# Patient Record
Sex: Male | Born: 1980
Health system: Southern US, Community
[De-identification: ages and names within clinical notes are randomized; demographics above are authoritative.]

## PROBLEM LIST (undated history)

## (undated) DIAGNOSIS — T4145XA Adverse effect of unspecified anesthetic, initial encounter: Secondary | ICD-10-CM

## (undated) DIAGNOSIS — F952 Tourette's disorder: Secondary | ICD-10-CM

## (undated) DIAGNOSIS — F429 Obsessive-compulsive disorder, unspecified: Secondary | ICD-10-CM

## (undated) DIAGNOSIS — F329 Major depressive disorder, single episode, unspecified: Secondary | ICD-10-CM

## (undated) DIAGNOSIS — I10 Essential (primary) hypertension: Secondary | ICD-10-CM

## (undated) DIAGNOSIS — T8859XA Other complications of anesthesia, initial encounter: Secondary | ICD-10-CM

## (undated) DIAGNOSIS — Z720 Tobacco use: Secondary | ICD-10-CM

## (undated) DIAGNOSIS — F32A Depression, unspecified: Secondary | ICD-10-CM

## (undated) DIAGNOSIS — Z8489 Family history of other specified conditions: Secondary | ICD-10-CM

## (undated) DIAGNOSIS — J45909 Unspecified asthma, uncomplicated: Secondary | ICD-10-CM

## (undated) HISTORY — PX: CIRCUMCISION: SUR203

---
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
## 2003-01-07 ENCOUNTER — Emergency Department (HOSPITAL_COMMUNITY): Admission: EM | Admit: 2003-01-07 | Discharge: 2003-01-08 | Payer: Self-pay | Admitting: *Deleted

## 2003-02-18 ENCOUNTER — Inpatient Hospital Stay (HOSPITAL_COMMUNITY): Admission: EM | Admit: 2003-02-18 | Discharge: 2003-02-24 | Payer: Self-pay | Admitting: Psychiatry

## 2003-03-04 ENCOUNTER — Emergency Department (HOSPITAL_COMMUNITY): Admission: EM | Admit: 2003-03-04 | Discharge: 2003-03-04 | Payer: Self-pay | Admitting: Emergency Medicine

## 2003-03-17 ENCOUNTER — Emergency Department (HOSPITAL_COMMUNITY): Admission: EM | Admit: 2003-03-17 | Discharge: 2003-03-17 | Payer: Self-pay | Admitting: Emergency Medicine

## 2003-07-23 ENCOUNTER — Inpatient Hospital Stay (HOSPITAL_COMMUNITY): Admission: EM | Admit: 2003-07-23 | Discharge: 2003-07-25 | Payer: Self-pay | Admitting: Psychiatry

## 2006-02-18 ENCOUNTER — Emergency Department: Payer: Self-pay | Admitting: Emergency Medicine

## 2006-07-20 ENCOUNTER — Ambulatory Visit: Payer: Self-pay | Admitting: Family Medicine

## 2006-07-28 ENCOUNTER — Emergency Department (HOSPITAL_COMMUNITY): Admission: EM | Admit: 2006-07-28 | Discharge: 2006-07-28 | Payer: Self-pay | Admitting: Emergency Medicine

## 2006-08-01 ENCOUNTER — Ambulatory Visit: Payer: Self-pay | Admitting: Family Medicine

## 2006-08-03 ENCOUNTER — Ambulatory Visit: Payer: Self-pay | Admitting: Family Medicine

## 2006-10-26 ENCOUNTER — Emergency Department (HOSPITAL_COMMUNITY): Admission: EM | Admit: 2006-10-26 | Discharge: 2006-10-26 | Payer: Self-pay | Admitting: Family Medicine

## 2006-11-27 ENCOUNTER — Ambulatory Visit: Payer: Self-pay | Admitting: Family Medicine

## 2007-04-14 ENCOUNTER — Emergency Department (HOSPITAL_COMMUNITY): Admission: EM | Admit: 2007-04-14 | Discharge: 2007-04-14 | Payer: Self-pay | Admitting: Family Medicine

## 2007-09-13 ENCOUNTER — Ambulatory Visit: Payer: Self-pay | Admitting: Internal Medicine

## 2007-11-21 ENCOUNTER — Ambulatory Visit: Payer: Self-pay | Admitting: Internal Medicine

## 2008-06-21 ENCOUNTER — Emergency Department (HOSPITAL_COMMUNITY): Admission: EM | Admit: 2008-06-21 | Discharge: 2008-06-21 | Payer: Self-pay | Admitting: Emergency Medicine

## 2008-10-02 ENCOUNTER — Emergency Department (HOSPITAL_COMMUNITY): Admission: EM | Admit: 2008-10-02 | Discharge: 2008-10-02 | Payer: Self-pay | Admitting: Emergency Medicine

## 2008-10-28 ENCOUNTER — Emergency Department (HOSPITAL_COMMUNITY): Admission: EM | Admit: 2008-10-28 | Discharge: 2008-10-28 | Payer: Self-pay | Admitting: Emergency Medicine

## 2009-01-26 ENCOUNTER — Emergency Department (HOSPITAL_COMMUNITY): Admission: EM | Admit: 2009-01-26 | Discharge: 2009-01-26 | Payer: Self-pay | Admitting: Emergency Medicine

## 2009-01-30 ENCOUNTER — Emergency Department (HOSPITAL_COMMUNITY): Admission: EM | Admit: 2009-01-30 | Discharge: 2009-01-30 | Payer: Self-pay | Admitting: Emergency Medicine

## 2009-02-05 ENCOUNTER — Ambulatory Visit: Payer: Self-pay | Admitting: Internal Medicine

## 2009-02-17 ENCOUNTER — Emergency Department (HOSPITAL_COMMUNITY): Admission: EM | Admit: 2009-02-17 | Discharge: 2009-02-18 | Payer: Self-pay | Admitting: *Deleted

## 2009-05-28 ENCOUNTER — Telehealth (INDEPENDENT_AMBULATORY_CARE_PROVIDER_SITE_OTHER): Payer: Self-pay | Admitting: *Deleted

## 2009-06-02 ENCOUNTER — Ambulatory Visit: Payer: Self-pay | Admitting: Internal Medicine

## 2010-08-20 ENCOUNTER — Emergency Department (HOSPITAL_COMMUNITY): Admission: EM | Admit: 2010-08-20 | Discharge: 2010-08-20 | Payer: Self-pay | Admitting: Family Medicine

## 2010-09-16 ENCOUNTER — Emergency Department (HOSPITAL_COMMUNITY)
Admission: EM | Admit: 2010-09-16 | Discharge: 2010-09-16 | Payer: Self-pay | Source: Home / Self Care | Admitting: Family Medicine

## 2011-01-17 LAB — POCT CARDIAC MARKERS: Troponin i, poc: <0.05 ng/mL (ref 0.00–0.09)

## 2011-01-17 LAB — D-DIMER, QUANTITATIVE: D-Dimer, Quant: 0.28 ug/mL-FEU (ref 0.00–0.48)

## 2011-02-18 NOTE — Discharge Summary (Signed)
[facility]  Patient:    Bryan Cummings, Bryan Cummings                       MRN: [id number] Adm. Date:  [id number] Disc. Date: 04/07/00 Attending:  Veneta Penton                           Discharge Summary  REASON FOR ADMISSION:  This 30 year old black male was admitted to [facility] after being discharged from this unit approximately two weeks prior to this admission, after he became increasing psychotic, assaultive and aggressive in his group home.  At that time he was a danger to himself and others, attempting to harm himself, destroy furniture in the group home, and threatening to kill staff in the group home.  For further history of present illness, please see the patients psychiatric admission assessment.  PHYSICAL EXAMINATION:  At the time of admission was significant for a history obesity and was otherwise unremarkable, also with the exception of a sleep related breathing disorder and a history of mild mental retardation.  LABORATORY DATA:  The patient underwent a laboratory work to rule out any medical problems contributing to his symptomatology.  A urinalysis was unremarkable.  Urine drug screen was negative.  There were not other labs performed during the course of this hospitalization due to the patients recent admission two weeks prior to this one.  There were no x-rays, special procedures, complications during the course of this hospitalization, and the patient received no consultations.  HOSPITAL COURSE:  The patient rapidly adapted to unit routine, socializing well with both patients and staff.  He was begun on a trial of risperidone and titrated up to a dose of 3 mg p.o. q.a.m. and 6 mg q.h.s.  His initial symptoms of psychosis significant for thought insertion, thought blocking, looseness of associations, and magical thinking and response to internal stimuli all began to improve.  At the time of discharge, the patient  denied any suicidal or homicidal ideations.  Affect and mood had improved.  His thoughts are more organized.  He continues to display occasional magical thinking but denies having any made thoughts or problems with thought insertion.  He denies having any suicidal or homicidal ideation or plans to harm himself or others.  He states that he feels well.  He is able to perform his activities of daily living and to participate in all aspects of the therapeutic program to the extent that it is felt that the patient may be discharged to a less restrictive alternative level of care.  CONDITION ON DISCHARGE:  Improved.  POST HOSPITAL CARE PLAN:  The patient is discharged back to his group home. He will follow up with his outpatient psychiatrist and psychotherapist for individual therapy and medication management, consequently I will sign off on the case at this time.  The patient is discharged on unrestricted level of activity and he will resume a regular diet.  DISCHARGE MEDICATIONS: 1. Risperdal 3 mg p.o. q.a.m. and 6 mg p.o. q.h.s. 2. Luvox 100 mg p.o. q.d. 3. Neurontin 600 mg p.o. t.i.d.  FINAL DIAGNOSIS: Axis I:    Schizoaffective disorder. Axis II:   Mild mental retardation. Axis III:  Obesity, sleep related breathing disorder. Axis IV:   Current psychosocial stressors are severe. Axis V:    At time of admission code 15, at time of discharge code 30. DD:  04/07/00 TD:  04/07/00 Job: [id number]

## 2011-02-18 NOTE — H&P (Signed)
NAME:  Bryan Cummings, Bryan Cummings                          ACCOUNT NO.:  [id number]   MEDICAL RECORD NO.:  [id number]                   PATIENT TYPE:  IPS   LOCATION:  0400                                 [facility]ACILITY[facility]   PHYSICIAN:  Jeanice Lim, M.D.              DATE OF BIRTH:  1981/08/25   DATE OF ADMISSION:  07/23/2003  DATE OF DISCHARGE:  07/25/2003                                HISTORY & PHYSICAL   IDENTIFYING INFORMATION:  A 30 year old single black male, involuntarily  committed on July 23, 2003.   HISTORY OF PRESENT ILLNESS:  The patient presents with a history of being  here on commitment.  Papers report that the patient was suicidal, depressed,  having positive auditory hallucinations.  The patient was unable to contract  for safety.  The patient felt like he needed a break from his living  situation.  He states that they are accusing him of drinking beer at an  assisted living facility.  The patient states he was getting very  frustrated.  He states that they thought that because he had an episode of  vomiting.  He was also having some positive suicidal ideation with no  specific plan.  His sleep and appetite have been good.  He denies any  auditory hallucinations at this time, denies any visual hallucinations, and  states he is ready to go back to the group home.   PAST PSYCHIATRIC HISTORY:  He is a 30 year male.  He was here at [facility] in May of 2004 for psychosis and aggressive behavior.  He has  no history of a suicide attempt.   SOCIAL HISTORY:  A 30 year old single black male.  His grandmother is his  legal guardian.  He lives at [facility] for the past month.  He states that  the living situation is good.   FAMILY HISTORY:  Mother with neurological problem.   ALCOHOL DRUG HISTORY:  The patient smokes.  He states he does drink beer,  but he states it is not a problem.  He denies any drug use.   PAST MEDICAL HISTORY:  Primary care Kodee Ravert is  unknown.  The patient  reports he goes for treatment at [facility].  Medical problems  are Tourette's.   MEDICATIONS:  Has been on Haldol 2 mg in the morning, 2 at bedtime, Depakote  500 mg b.i.d., Cogentin 1 mg at h.s., trazodone 150 mg q.h.s..   DRUG ALLERGIES:  PENICILLIN.   PHYSICAL EXAMINATION:  The patient was assessed at [facility] with  no acute findings.  He is in no acute distress today.  He is a well-  nourished, well-developed young man.   LABORATORY DATA:  Valporic acid level of 66.3.  His CBC:  RDW is mildly  elevated at 16.1.  Urine drug screen is negative, CMET is within normal  limits.   MENTAL STATUS EXAM:  He is alert, morbidly obese, unkempt male.  There is no  eye contact.  He is somewhat malodorous.  Speech is clear.  The patient  states he feels fine.  He is agreeable and pleasant.  Thought processes:  Does not appear to be distracted by external stimuli, is voicing no suicidal  or homicidal ideations or delusions or flight of ideas.  Cognitive function  intact.  Memory is fair, judgment and insight are fair, appears to have  limited intellectual functioning.   ADMISSION DIAGNOSES:   AXIS I:  Major depressive disorder, severe, recurrent, rule out  schizoaffective disorder.   AXIS II:  Mild mental retardation.   AXIS III:  Tourette's.   AXIS IV:  Other psychosocial problems.   AXIS V:  Current is 30, past year is 67.   PLAN:  Involuntary commitment for psychosis and suicidal ideation.  Stabilize mood and thinking so the patient can be safe and functional.  Will  resume his medications, will check valporic acid level for therapeutic  range, will contact assisted living for concerns and background.  The  patient is to continue to be medication compliant.  Will have a nutritional  consult for the patient's morbid obesity, nutritional education.   TENTATIVE LENGTH OF CARE:  3-4 days.  ]     Landry Corporal, N.P.                        Jeanice Lim, M.D.    JO/MEDQ  D:  08/19/2003  T:  08/20/2003  Job:  [id number]

## 2011-02-18 NOTE — Discharge Summary (Signed)
[facility]  Patient:    Bryan Cummings, Bryan Cummings                       MRN: [id number] Adm. Date:  [id number] Disc. Date: [id number] Attending:  Veneta Penton                           Discharge Summary  REASON FOR ADMISSION:  This 30 year old black male was admitted with command auditory hallucinations telling him to kill himself and his peers in his group home.  For further history of present illness, please see the patients psychiatric admission assessment.  PHYSICAL EXAMINATION:  Remarkable only for his being overweight and having acne vulgaris.  He has an otherwise unremarkable physical examination.  LABORATORY EXAMINATION:  The patient underwent a laboratory workup to rule out any medical problems contributing to his symptomatology.  A CBC was remarkable for an RDW of 14.5 and was otherwise unremarkable.  Routine chem panel was within normal limits.  A hepatic panel was within normal limits.  UA was unremarkable.  A urine drug screen was negative.  Urine probe for gonorrhea and chlamydia were negative.  A thyroid panel was within normal limits.  RPR was nonreactive.  The patient received no x-rays, no special procedures, no additional consultations.  He sustained no complications throughout his hospital course.  HOSPITAL COURSE:  The patient rapidly adapted to unit routine, socializing well with both patients and staff.  He was increased on his Geodon and Neurontin to attempt to stabilize his mood and psychotic symptoms and at the time of discharge, the patient denies any auditory hallucinations.  He continues to display some magical thinking and thought insertion consistent with his previous diagnosis of schizoaffective disorder, but he is participating in all aspects of therapeutic treatment program.  He denies any homicidal or suicidal ideation.  He has shown no assaultive or aggressive behaviors while hospitalized.  He has been cooperative and  easily redirectable within the milieu.  He is able to perform all of his activities of daily living and consequently, he is felt to have reached his maximum benefits of hospitalization and is ready for discharge to a less restrictive alternative setting.  CONDITION ON DISCHARGE:  Improved.  DIAGNOSES: Axis I:    1. Schizoaffective disorder.            2. Conduct disorder. Axis II:   Schizotypal traits, rule out personality disorder. Axis III:  1. Acne vulgaris.            2. Obesity. Axis IV:   Current psychosocial stressors are severe. Axis V:    Code 10 on admission, code 30 on discharge.  FURTHER EVALUATION AND TREATMENT RECOMMENDATIONS: 1. The patient is discharged back to the care of his group home. 2. He is discharged on an unrestricted level of activity and a regular    diet.  DISCHARGE MEDICATIONS: 1. He is discharged on Neurontin 900 mg p.o. t.i.d. 2. Geodon 160 mg p.o. q.h.s. 3. Luvox 100 mg p.o. q.d. 4. _________ 100 mg p.o. q.d.  FOLLOW-UP:  He is to follow up with the dietitian at [facility] for evaluation of his obesity on Monday, March 4, at 10 a.m. He will follow up with Dr. Chestine Spore at [facility] for all further aspects of his psychiatric care and consequently, I will sign off on the case at this time. DD:  12/01/00  TD:  12/02/00 Job: [id number]

## 2011-02-18 NOTE — Discharge Summary (Signed)
NAME:  Bryan Cummings, ZIETZ                          ACCOUNT NO.:  [id number]   MEDICAL RECORD NO.:  [id number]                   PATIENT TYPE:  IPS   LOCATION:  0400                                 [facility]ACILITY:  [facility]   PHYSICIAN:  Jeanice Lim, M.D.              DATE OF BIRTH:  1981/05/20   DATE OF ADMISSION:  07/23/2003  DATE OF DISCHARGE:  07/25/2003                                 DISCHARGE SUMMARY   IDENTIFYING DATA:  This is a 30 year old single African-American male  involuntarily committed presenting with a history of possible suicidal  reports and depressed as well as auditory hallucinations, unable to contract  for safety, needing a break from family situation, getting frustrated.  He  reported suicidal thoughts with no plan.  He denied auditory hallucinations  at the time of admission.   MEDICATIONS:  1. Haldol 2 mg q.a.m. and 4 mg q.h.s.  2. Depakote 500 mg b.i.d.  3. Cogentin 1 mg q.h.s.  4. Trazodone 150 mg q.h.s.   DRUG ALLERGIES:  PENICILLIN.   PHYSICAL EXAMINATION:  GENERAL:  Within normal limits.  NEUROLOGIC:  Nonfocal.   LABORATORY DATA:  Routine admission labs:  Within normal limits.   MENTAL STATUS EXAM:  Alert, markedly obese, unkempt, malodorous male with  poor eye contact.  Speech: Clear.  Mood: Fine, agreeable, somewhat  dysphoric.  Affect: Restricted.  Negative for dangerous ideation or  psychotic symptoms.  Cognitive: Intact.  Judgment and insight: Fair to poor.   ADMISSION DIAGNOSES:   AXIS I:  1. Major depressive disorder, severe, recurrent.  2. Rule out schizoaffective disorder.   AXIS II:  Mild mental retardation.   AXIS III:  History of Tourette's.   AXIS IV:  Moderate problems with primary support group and other  psychosocial issues.   AXIS V:  30/55   HOSPITAL COURSE:  The patient was admitted, ordered routine p.r.n.  medications, underwent further monitoring, and was encouraged to participate  in individual, group, and milieu therapy.   The patient was on safety checks  and mood symptoms were targeted with medications.  The patient reported a  gradual improvement in mood, stabilizing, feeling less angry and irritable.  Suicidal thoughts resolved, reporting a positive response to crisis  stabilization.  Grandmother was contacted and felt the patient was at  baseline and ready for discharge, agreeing with discharge and aftercare  plan.   CONDITION ON DISCHARGE:  The patient's condition on discharge was improved.  Mood was more euthymic.  Affect: Brighter.  Thought processes: Goal  directed.  Thought content: Negative for dangerous ideation or psychotic  symptoms.   DISCHARGE MEDICATIONS:  1. Haldol 2 mg q.a.m. and 4 mg q.h.s.  2. Depakote 500 mg q.a.m. and q.h.s.  3. Cogentin 1 mg q.h.s.  4. Trazodone 150 mg q.h.s.   FOLLOW UP:  The patient was to follow up with [facility], followup medication management.   DISCHARGE DIAGNOSES:   AXIS I:  1. Major depressive disorder, severe, recurrent.  2. Rule out schizoaffective disorder.   AXIS II:  Mild mental retardation.   AXIS III:  History of Tourette's.   AXIS IV:  Moderate problems with primary support group and other  psychosocial issues.   AXIS V:  Global assessment of functioning on discharge was 55.                                               Jeanice Lim, M.D.    JEM/MEDQ  D:  08/20/2003  T:  08/20/2003  Job:  [id number]

## 2011-02-18 NOTE — H&P (Signed)
[facility]  Patient:    Bryan Cummings, Bryan Cummings                       MRN: [id number] Adm. Date:  [id number] Attending:  Veneta Penton                   Psychiatric Admission Assessment  DATE OF ADMISSION:  April 20, 2000.  PATIENT IDENTIFICATION:  This 30 year old black male was admitted for increasing symptoms of psychosis and increasing agitation to a point where he had been threatening to kill one of the peers in his group home as well as threatening to ill staff members and "anybody that comes near me."  The patient was involuntarily committed to this facility for inpatient psychiatric stabilization.  HISTORY OF PRESENT ILLNESS:  The patient is well known to me from this approximately his third or fourth admission to this facility over the past one to two months.  At the present time, he reports that since his discharge from the hospital approximately two weeks ago, he has been experiencing increasingly irritable, angry and depressed mood, increasingly disorganized thoughts, as well as increasing persecutory delusions and ideas of reference, feeling that people in his group home are out to get him.  He admits to decreased ability to perform his activities of daily living, haven given up on almost all activities.  He admits to anhedonia, auditory hallucinations, feelings of hopelessness, helplessness, worthlessness, decreased concentration, decreased energy.  He has been running away from the group home multiple times in the evening, and destroying furniture in the group home, as well as having profound outbursts of rage that are no longer containable within that facility.  The patient also admits to magical thinking and increasing psychosis.  He denies any suicidal ideation.  He states he does not wish to harm anybody outside of his group home and he is comfortable being in the hospital.  He has contracted not to harm himself or anyone else  while here.  He denies any obsessions, compulsions, panic attacks.  He denies any alcohol use.  He does admit to using marijuana in the past but refuses to discuss his most recent use of marijuana.  He continues to admit to smoking one to two packs of cigarettes per day.  He denies any other street drug use.  PAST PSYCHIATRIC HISTORY:  Significant for his seeing Dr. Wynonia Lawman, his outpatient psychiatrist and Bing Ree, his outpatient psychotherapist, for individual therapy and medication management.  He has a past psychiatric history of Tourettes disorder, but reports he has not been experiencing any tics lately, particularly since recently increasing his dose of Risperdal.  He has history of mild mental retardation and conduct disorder, characterized by stealing, running away, cruelty to animals, destruction of property and getting into fights with others.  PAST MEDICAL HISTORY:  Significant for obesity and a sleep-related eating disorder.  He otherwise denies any history of medical or surgical problems. He has no known drug allergies or sensitivities.  His medications at the time of admission include Risperdal 3 mg p.o. bid, Neurontin 600 mg p.o. t.i.d., Luvox 100 mg p.o. q.d.  MENTAL STATUS EXAMINATION:  The patient presents as a well-developed, well-nourished obese adolescent black male who is hypervigilent, suspicious, guarded, psychomotor agitated, and whose appearance is compatible with his stated age.  His speech is coherent, somewhat pressured with occasional looseness of associations and phonemic errors.  He demonstrates poor impulse control.  His thoughts are  disorganized.  His affect and mood are irritable, hostile, angry and depressed.  He admits to ideas of reference, persecutory delusions as described above.  As well, he has also been experiencing magical thinking, made thoughts and thought insertion.  He denies any plans to harm himself or others at the present time while  he is in the hospital.  His insight is poor, judgment is poor, intelligence is somewhat below average.  He is unable to do similarities, differences, or proverbs.  His immediate recall, short term memory and remote memory are intact to the extent that they are compatible with his level of intelligence.  ADMISSION DIAGNOSES: Axis I:    1. Schizoaffective disorder.            2. Tourettes disorder.            3. Conduct disorder. Axis II:   Mild mental retardation. Axis III:  Obesity, sleep related breathing disorder. Axis IV:   Current psychosocial stressors are severe. Axis V:    Code 15 on admission.  ASSETS AND STRENGTHS:  He is well connected into the DSS system.  INITIAL PLAN OF CARE:  To continue the patient on Neurontin and Luvox. Neurontin and Luvox may both need to be increased, but at the present time due to the overwhelming symptoms of psychosis the patient is experiencing, I will titrating his Risperdal up to a therapeutic dose.  While hospitalized, psychotherapy will focus on improving the patients impulse control, reality testing decreasing cognitive distortions, and decreasing the potential for him to harm himself or others.  A laboratory work-up will also be initiated to rule out any medical problems contributing to his symptomatology.  ESTIMATED LENGTH OF STAY:  Five to seven days.  POST HOSPITAL CARE PLAN:  Discharge the patient back to his group home. DD:  04/21/00 TD:  04/22/00 Job: [id number]

## 2011-02-18 NOTE — H&P (Signed)
[facility]  Patient:    Bryan Cummings, Bryan Cummings                       MRN: [id number] Adm. Date:  [id number] Attending:  Veneta Penton                   Psychiatric Admission Assessment  REASON FOR ADMISSION:  This 30 year old black male was admitted with command auditory hallucinations telling him to kill himself and his peers at his group home.  He was unable to contract for safety and was involuntary committed to this facility.  HISTORY OF PRESENT ILLNESS:  Patient is well-known to me from multiple previous psychiatric hospitalizations.  He reports, on this occasion, that over the past several weeks he has had increasingly depressed, irritable and angry mood most of the day, nearly every day, giving up on activities previously found pleasurable, psychomotor agitation, anhedonia, decreased appetite, weight loss, insomnia, feelings of hopelessness, helplessness, worthlessness, decreased concentration and energy level, increased symptoms of fatigue, recurrent thoughts of death.  He reports that he is unable to contract for safety at this time.  He states that he no longer feels he can control himself.  He had gotten into a minor argument over watching a television program with a peer and this escalated into the point where the patient reports that he was trying to kill the peer.  He feels that he wants to kill himself at this time by hanging himself.  PAST PSYCHIATRIC HISTORY:  Being followed at the outpatient center at [facility] by Dr. Chestine Spore.  He has eight previous inpatient psychiatric hospitalizations, two in [place], two at [facility] and four at [facility].  He has a past history of alcohol and cannabis abuse.  He refuses to discuss the amount he is currently consuming.  PAST MEDICAL HISTORY:  Acne vulgaris and obesity.  ALLERGIES:  He is allergic to PENICILLIN.  He otherwise denies any  allergies or drug sensitivities.  CURRENT MEDICATIONS:  Neurontin 600 mg p.o. t.i.d., Geodon 80 mg p.o. b.i.d., Luvox 100 mg p.o. q.d., Minocin 100 mg p.o. q.d. which he takes for his acne.  FAMILY/SOCIAL HISTORY:  Mother is psychotic.  The patient is currently in DSS custody and living in a group home.  STRENGTHS AND ASSETS:  He is well-connected into the DSS system.  MENTAL STATUS EXAMINATION:  The patient presents as a well-developed, well-nourished, obese black male who is alert and oriented x 4.  Psychomotor agitated.  Appearance is compatible with his stated age.  His affect is flat. Mood is irritable, depressed and angry.  He displays poor impulse control.  He is responding to auditory hallucinations and internal stimuli.  He admits to persecutory delusions and bizarre delusions.  His thoughts are disorganized. He admits to magical thinking and thought insertion.  His immediate recall, short-term memory and remote memory are intact.  DIAGNOSES:  (According to DSM-IV). Axis I:    1. Schizoaffective disorder.            2. Conduct disorder. Axis II:   1. Rule out learning disorder not otherwise specified.            2. Rule out personality disorder. Axis III:  1. Obesity.            2. Acne vulgaris. Axis IV:   Current psychosocial stressors are severe. Axis V:    10-20 on admission.  ESTIMATED LENGTH OF STAY:  Five to seven days.  INITIAL DISCHARGE PLAN:  Discharge the patient back to the care of his group home.  INITIAL PLAN OF CARE:  Continue the patient on Luvox.  Increase his Geodon and attempt to put him on a once a day h.s. dose.  Neurontin will also be increased to attempt to stabilize his mood.  A laboratory workup will be initiated to rule out any medical problems contributing to his symptomatology. Psychotherapy will focus on decreasing cognitive distortions and potential for harm to self and others. DD:  11/28/00 TD:  11/29/00 Job: [id number]

## 2011-02-18 NOTE — Discharge Summary (Signed)
[facility]  Patient:    Bryan Cummings, Bryan Cummings                       MRN: [id number] Adm. Date:  [id number] Disc. Date: [id number] Attending:  Veneta Penton                           Discharge Summary  REASON FOR ADMISSION:  This 30 year old white male was admitted for inpatient psychiatric stabilization because of increasing symptoms of psychosis and assaultive behavior with threat to kill himself and others.  For further history of present illness, please see the patients psychiatric admission assessment.  PHYSICAL EXAMINATION:  At the time of admission was significant for being obese, having a history of sleep apnea and otherwise unremarkable.  LABORATORY DATA:  Entirely unremarkable.  Because of this being a recent readmission, the patient only received a urine drug screen which was negative and a GCCT probe of the urine which was negative for gonorrhea or chlamydia.  HOSPITAL COURSE:  The patient rapidly adapted to unit routine, socializing well with both patients and staff.  He continued to display oppositional defiant behavior, continued to make occasional threats of assaultive behavior. He was able to be redirected by staff.  His Risperdal was titrated up to a therapeutic range. He was continued on Luvox and Neurontin at his admission doses.  At the time of discharge, his affect and mood have improved. HIs thoughts are far less disorganized.  He continues to occasionally display magical thinking, thought insertion and occasional looseness or associations, but by and large has done well and is able to participate in all aspects of the treatment program and consequently is felt to have reached his maximum benefits of hospitalization and is ready for discharge to a less restrictive alternative setting.  CONDITION ON DISCHARGE:  Improved.  DSM-IV DIAGNOSES: Axis I:   1. Schizoaffective disorder.           2. Tourettes disorder.           3.  Conduct disorder. Axis II:  Mild mental retardation. Axis III: 1. Sleep apnea.           2. Obesity. Axis IV:  Current psychosocial stressors are severe. Axis V:   At time of admission code 10, at time of discharge code 30.  FURTHER EVALUATION AND TREATMENT RECOMMENDATIONS: 1. The patient is discharged to the care of his DSS worker and will be    reassigned to a new group home. 2. The patient will follow up with Dr. Chestine Spore at the [facility] for all further aspects of his psychiatric care and consequently, I    will sign off on the case at this time. 3. The patient will also follow up at the [facility] with    his outpatient therapist for further psychotherapy. 4. The patient is discharged on unrestricted level of activity and a regular    diet. 5. The patient is discharged on Risperdal 3 mg in the morning and 9 mg q.hs.,    Luvox 100 mg p.o. q.d., Neurontin 600 mh p.o. t.i.d. DD:  05/19/01 TD:  05/20/00 Job: [id number]

## 2011-02-18 NOTE — Discharge Summary (Signed)
[facility]  Patient:    Bryan Cummings, Bryan Cummings                       MRN: [id number] Adm. Date:  [id number] Disc. Date: [id number] Attending:  Milford Cage H                           Discharge Summary  PATIENT IDENTIFICATION:  Patient was a 30 year old male.  INITIAL ASSESSMENT AND DIAGNOSIS:  Patient was admitted to the hospital after refusing to go back to his group home and having a history of recent runaways. He said he had been in this group home for about six weeks.  He did not like it.  He thought the group home leader was two-faced.  He said the man was mean and rude and grabbed things from him, etc.  He went to his grandmothers house.  She wanted him to go to church twice on Sunday.  He said once was enough so he ran away from grandmothers house after having runaway from the group home.  He is a retarded person and is being out there is considered dangerous at times because of his poor judgment.  MENTAL STATUS:  At the time of the initial evaluation, revealed an alert, oriented young man who was cooperative.  His speech was hard to understand because he tended to slur.  He denied suicidal thoughts or threats towards anyone else.  He had a history of hearing voices and seeing things but denied both at this point.  There was no evidence of any psychosis.  Judgment currently seemed impaired by his not thinking of consequences.  Insight was minimal.  Intellectual functioning was reportedly mild mental retardation. Concentration was adequate.  Other pertinent history can be obtained from the psychosocial service summary.  PHYSICAL EXAMINATION:  Noncontributory.  ADMITTING DIAGNOSES: Axis I:    1. Adjustment disorder with mixed disturbance of conduct and               emotions.            2. Tourettes syndrome, by history. Axis II:   Mild mental retardation. Axis III:  1. Sleep apnea, by history.            2. Overweight. Axis IV:   Severe. Axis V:     45 /55.  FINDINGS:  All indicated laboratory examinations were within normal limits or noncontributory.  HOSPITAL COURSE:  While in the hospital, patient was no behavioral problem. He continued to insist initially that he did not want to go back to the group home but, by the time of discharge, he was excited about going to the group home because the group home person came to visit and told him they were excited about having him back and new people had come in that they thought he might like.  Because he had done so much better in the structured placement, both her and at the previous group home, the decision was made to at least give him more time there and try to come up with some other interim placement that would help him towards independent living by the time of discharge.  CONDITION ON DISCHARGE:  Patient had consistently denied any threats towards himself or anyone else.  He was no behavioral problem in the hospital.  POST-HOSPITAL CARE PLANS:  He will follow up with Dr. Chestine Spore with an appointment for 02/15/00 and Bing Ree will  continue to see him as his therapist at [facility] where he will be staying.  DISCHARGE MEDICATIONS: 1. Neurontin 400 mg t.i.d. 2. Luvox 100 mg b.i.d. 3. Geodon 40 mg q.h.s.  ACTIVITY AND DIET:  There were no restrictions placed on his activity or his diet.  FINAL DIAGNOSES: Axis I:    1. Adjustment disorder with mixed disturbance of conduct and               emotions.            2. Tourettes syndrome. Axis II:   Mild mental retardation. Axis III:  Sleep apnea, by history. Axis IV:   Severe. Axis V:    50. DD:  02/21/00 TD:  02/22/00 Job: [id number]

## 2011-02-18 NOTE — Discharge Summary (Signed)
[facility]  Patient:    Bryan Cummings, Bryan Cummings                       MRN: [id number] Adm. Date:  [id number] Disc. Date: [id number] Attending:  Veneta Penton                           Discharge Summary  REASON FOR ADMISSION:  This 30 year old black male was admitted for increasing symptoms of psychosis and increasing agitation to a point where he had been threatening to kill one of his peers in his group home as well as threatening to kill staff members and "anybody that comes near me."  The patient was involuntarily committed to this facility for inpatient psychiatric stabilization.  For further history of present illness, please see the patients psychiatric admission assessment.  PHYSICAL EXAMINATION:  His physical examination at the time of admission was significant for a sleep-related breathing disorder as well as a history of obesity.  LABORATORY EXAMINATION:  The patient underwent a laboratory workup to rule out any medical problems contributing to his symptomatology.  A metabolic panel was unremarkable.  CBC was within normal limits.  A UA was unremarkable.  The patient also had had a recent hospitalization at [facility]; other laboratories such as thyroid function tests were performed during that time. Please see his previous admissions for more detailed laboratory workup that was done to rule out any medical problems that could have been contributing to his present symptomatology.  The patient received no x-rays, he received on other consultations, he received no special procedures during the portion of his hospitalization and he sustained no complications.  HOSPITAL COURSE:  The patient rapidly adapted to unit routine, socializing well with patients and staff.  Because of his problems with psychosis, he was titrated upward on Risperdal to a dose of 3 mg in the morning and 6 mg q.h.s.; he responded well to this.  At the time of discharge, the  patient denies any homicidal or suicidal ideation.  His thought processes are more organized and goal-directed.  He is able to participate in all aspects of the therapeutic program and consequently, is felt to have reached his maximum benefits of hospitalization and is ready to be discharged to a less restrictive alternative.  His speech continues to display occasional looseness of associations.  His impulse control has greatly improved.  He is no longer displaying an assaultive, aggressive or in any way agitated behavior and his persecutory delusions have resolved.  CONDITION ON DISCHARGE:  Improved.  DIAGNOSES ACCORDING TO DIAGNOSTIC AND STATISTICAL MANUAL OF MENTAL DISORDERS, 4TH EDITION: Axes I:    1. Schizoaffective disorder.            2. Tourettes disorder.            3. Conduct disorder. Axis II:   Mild mental retardation. Axes III:  1. Sleep-related breathing disorder.            2. Obesity. Axis IV:   Current psychosocial stressors are severe. Axis V:    Code 15 on admission; code 30 on discharge.  FURTHER EVALUATION AND TREATMENT RECOMMENDATIONS: 1. The patient will follow up with his outpatient [facility] aspects of his psychiatric    care and consequently, I will sign off on the case at this time. 2. The patient is discharged on  a regular diet and an unrestricted level of    activity. 3. The patient is discharged on Risperdal 30 mg p.o. q.a.m. and 6 mg q.h.s.,    Neurontin 600 mg p.o. t.i.d., Luvox 100 mg p.o. q.h.s., clonazepam 0.5 mg    p.o. t.i.d. DD:  04/26/00 TD:  04/28/00 Job: [id number]

## 2011-02-18 NOTE — Discharge Summary (Signed)
[facility]  Patient:    ROMELO, Bryan Cummings                       MRN: [id number] Adm. Date:  [id number] Disc. Date: 01/29/01 Attending:  Veneta Penton                           Discharge Summary  REASON FOR ADMISSION:  This 30 year old black male was admitted involuntarily complaining of depression, increasing over the past 2 to 3 weeks, along with suicidal ideation with a plan to electrocute himself, and homicidal ideation with a plan to kill a peer at his group home.  For further history of present illness, please see the patients psychiatric admission assessment.  PHYSICAL EXAMINATION:  Significant for morbid obesity and was otherwise unremarkable.  LABORATORY EXAMINATION:  The patient underwent a laboratory work-up to rule out any medical problems contributing to his symptomatology.   Urine drug screen was positive for metabolites of marijuana.  His previous laboratory examination was performed during his previous admission.  The patient received no x-rays, no special procedures, no additional consultations.  He sustained no complications during the course of this hospitalization.  HOSPITAL COURSE:  He rapidly adapted to unit routine socializing well with both patients and staff.  On admission, he was depressed, irritable, and anxious.  He displayed thought insertion and magical thinking consistent with his previous diagnosis of schizoaffective disorder.  He has been participating in all aspects of the therapeutic treatment program.  Over the course of this hospitalization, his affect and mood have improved.  He no longer displays any significant psychotic symptomatology.  He denies any homicidal or suicidal ideation.  He is able to perform all his activities of daily living.  He has not been involved in any assaultive or aggressive behavior, and consequently as he no longer appears to be a danger to himself or others, he is felt to have  reached the maximum benefits of hospitalization and is ready for discharge to a less restricted alternative setting.  CONDITION ON DISCHARGE:  Improved  FINAL DIAGNOSIS: Axis I:    1. Schizoaffective disorder.            2. Cannabis dependence.            3. Nicotine dependence.            4. Conduct disorder. Axis II:   1. Rule out personality disorder not otherwise specified.            2. Rule out learning disorder not otherwise specified. Axis III:  Morbid obesity, acne vulgaris. Axis IV:   Current psychosocial stressors are severe. Axis V:    Code 30.  FURTHER EVALUATION AND TREATMENT RECOMMENDATIONS: 1. The patient is discharged to his group home. 2. He is discharged on an unrestricted level of activity and a regular diet. 3. He will follow up with [facility] for all further    aspects of his mental health care, and consequently I will sign off on    the case at this time.  DISCHARGE MEDICATIONS: 1. Minocycline 100 mg p.o. q.d. as prescribed by his primary care physician. 2. Geodon 160 mg p.o. q.h.s. 3. Neurontin 900 mg p.o. t.i.d. 4. Luvox 100 mg p.o. q.d. DD:  01/29/01 TD:  01/29/01 Job: [id number]

## 2011-02-18 NOTE — H&P (Signed)
[facility]  Patient:    Bryan Cummings, Bryan Cummings                       MRN: [id number] Adm. Date:  [id number] Attending:  Jasmine Pang                   Psychiatric Admission Assessment  PATIENT IDENTIFICATION:  Patient is a 30 year old male.  CHIEF COMPLAINT:  Patient reportedly was getting aggressive at his group home, was hearing muffled voices and had run away from the group home.  HISTORY LEADING UP TO THE PRESENT ILLNESS:  Patient was a patient here about a month ago.  He was returned to [facility].  At the time, he said he did not want to go back.  He was very clear that it was not working out and he was not going to go there.  However, he was talked into going back and, at the moment of discharge, seemed positive about seeing things work out.  He says he tried it for two weeks.  Things did not work out.  They were the same as before or worse.  He does not want to go back to that group home and he is not going to do it.  This time, he cannot be made to do it, he says.  Consequently, he ran away.  He presumably was not taking his medications while he was on the streets.  He reportedly has heard voices.  They are supposedly muffled voices. It is hard to know with patient because he does have some retardation and his stories do not necessarily reflect what is actually going on in his head.  He was very clear and logical about reporting the events that has occurred and they made sense.  There was no evidence of any psychosis in talking to him currently.  Nevertheless, people were concerned that he was out of control and might hurt himself or someone else and he was admitted.  PREVIOUS PSYCHIATRIC TREATMENT:  He has been at [facility], [facility] and this hospital; most recently in about the last month and, in between times, at [facility] and the mental health center.  DRUG ALCOHOL AND LEGAL ISSUES:  He denies any substance use.  He says  he smokes cigarettes.  He has no legal problems currently.  MEDICAL PROBLEMS, ALLERGIES AND MEDICATIONS:  He has no medical problems other than being overweight and sleep apnea.  He has no known allergies.  He is currently taking Risperdal, Luvox and Neurontin.  Reportedly, Geodon was not helping but it also was not used to the maximum dose and was apparently stopped.  MENTAL STATUS:  At the time of the evaluation, revealed an alert, oriented young man who came to the interview willingly and was cooperative.  He was appropriately dressed and groomed.  He made good eye contact.  He was logical and reasonable.  He basically said it did not work out.  He did not want to be at the group home.  He did get mad at people there and was making threats but he left and was on the streets, staying with various friends.  Off and on, he would go to his grandmothers house or his relatives houses and he just did not want to be in the group home and he would continue doing what it took not to go to the group home.  He denied hearing any voices.  There was no  evidence that he was hearing voices.  There was no other evidence of any psychosis.  No delusions apparent and no flat affect or inappropriate affect.  His dissociations were tight and logical and coherent.  He has a history of mild mental retardation but that would not be obvious in this interview.  Insight was minimal.  Concentration was adequate.  PATIENT ASSETS:  He has a supportive family and workers and he is cooperative.  ADMITTING DIAGNOSES: Axis I:    1. Oppositional defiant disorder.            2. Tourettes syndrome. Axis II:   Mild mental retardation. Axis III:  1. Overweight.            2. Sleep apnea, by history. Axis IV:   Severe. Axis V:    50/55.  INITIAL TREATMENT PLAN:  Estimated length of hospitalization is 3-5 days. Plan is to stabilize once again to the point where he has some program that he is willing to participate in  if possible or at least not making threats towards himself or anyone else. DD:  03/10/00 TD:  03/12/00 Job: [id number]

## 2011-02-18 NOTE — Discharge Summary (Signed)
NAME:  Bryan Cummings, Bryan Cummings                          ACCOUNT NO.:  [id number]   MEDICAL RECORD NO.:  [id number]                   PATIENT TYPE:  IPS   LOCATION:  0403                                 FACILITY:  [facility]   PHYSICIAN:  Jeanice Lim, M.D.              DATE OF BIRTH:  August 19, 1981   DATE OF ADMISSION:  02/18/2003  DATE OF DISCHARGE:  02/24/2003                                 DISCHARGE SUMMARY   IDENTIFYING DATA:  This is a 30 year old African-American male with a  history of psychosis, threatening fellow residents, aggressive, hard to calm  down, heard voices telling him to hurt others with a knife.   ADMISSION MEDICATIONS:  Medications Depakote ER 1000 mg b.i.d. and Haldol  Decanoate 100 mg q.14 days, last one May 5.   ALLERGIES:  CHOCOLATE, PENICILLIN.   PHYSICAL EXAMINATION:  Essentially within normal limits, neurologically  nonfocal.   ROUTINE ADMISSION LABS:  Essentially within normal limits.   MENTAL STATUS EXAM:  The patient was obese but healthy-appearing African-  American male, smiling, somewhat distractible.  Speech within normal limits.  Mood calm, mildly irritable at times.  Affect restricted.  Thought process  positive for paranoid ideation, vague ideas of reference, auditory  hallucinations as well as delusions, feeling that people are against him,  may try to hurt him.  Cognitively intact.  Judgment and insight fair to  poor.   ADMISSION DIAGNOSES:   AXIS I:  1. Schizoaffective disorder.  2. Conduct disorder by history.   AXIS II:  Learning disorder not otherwise specified.   AXIS III:  Obesity.   AXIS IV:  Moderate problems related to primary support group.   AXIS V:  20/55.   HOSPITAL COURSE:  The patient was admitted and ordered routine p.r.n.  medications, underwent further monitoring, and was encouraged to participate  in individual, group and milieu therapy.  The patient was given the Haldol  Decanoate dose for acute psychotic symptoms and  was monitored regarding  aggression and threatening behavior and thoughts.  The patient reported  decrease in these violent thoughts and a gradual decrease in paranoid  ideation as he was stabilized on medications.  He reported a positive  response to medication changes, with no side effects, became more  appropriate on the unit, with a slight increase in judgment and insight and  coping skills.   CONDITION ON DISCHARGE:  Improved.  Mood was more euthymic, affect brighter,  thought process goal directed.  Thought content negative for command  hallucinations.  The patient was having no distressing psychotic symptoms  and no suicidal or homicidal thoughts, feeling that he could remain safe and  get along with people at the group home.   DISCHARGE MEDICATIONS:  1. Depakote ER 500 mg 2 tabs b.i.d.  2. Haldol 2 mg 1 q.a.m. and 2 q.h.s.  3. Trazodone 100 mg q.h.s.  4. Seroquel 100 mg 1 q.h.s.  5. The patient was given Haldol Decanoate 200 mg IM on May 19 and this was     to continue as previously prescribed.   DISCHARGE DIAGNOSES:   AXIS I:  1. Schizoaffective disorder.  2. Conduct disorder by history.   AXIS II:  Learning disorder not otherwise specified.   AXIS III:  Obesity.   AXIS IV:  Moderate problems related to primary support group.   AXIS V:  Global assessment of function on discharge was 50-55.   DISCHARGE INSTRUCTIONS:  He was to follow up at [facility] on Thursday, May 27 at 9:30.                                                Jeanice Lim, M.D.    JEM/MEDQ  D:  03/20/2003  T:  03/20/2003  Job:  [id number]

## 2011-02-18 NOTE — H&P (Signed)
[facility]  Patient:    Bryan Cummings, Bryan Cummings                       MRN: [id number] Adm. Date:  [id number] Attending:  Jasmine Pang                   Psychiatric Admission Assessment  Kenroy is a 30 year old male.  CHIEF COMPLAINT:  Bodey was admitted to the hospital after refusing to go back to his group home and having a history of recent runaways.  HISTORY LEADING UP TO PRESENT ILLNESS:  Tarig says he has been in this group home for about the past six weeks.  He does not like it.  He says the group home leader acts one way in front of parents and another way when he is just with the residents.  He is rude, he is mean, he grabs things from them, he takes out his bad day on the residents, Carlon says, and he does not want to live there.  He was told after the first few weeks to at least give it a month, so he gave it a month, and he still does not like it, and he says he will not go back.  He spent the weekend with his grandmother.  She wanted him to go to church twice on one day and he did not want to go, so he left home there also without permission and came back later in the day.  Apparently the concern is that when he goes out on his own he is endangering himself.  He does have mild mental retardation and that seems to influence the decisions he makes.  FAMILY/SCHOOL/SOCIAL HISTORIES:  He says he has been in group homes for about the last three years.  Prior to that he lived at home.  He has always lived with his grandmother.  His mother he says has never taken care of him.  He is close to his family.  He has cousins that he likes as well as uncles and aunts that he gets along with, and he cares about his grandmother.  He wants to be home, he says, and not in group homes.  School he says goes okay.  He is in the 10th grade.  He is going to an alternative school at the moment.  He believes he can finish the year and pass the 10th grade.  He is not  sure what he wants to do with himself ultimately. He denied any sexual or physical abuse over the years.  He does believe he has friends and basically likes school.  His father he says has never been in the picture.  He has no interest in getting to know him because his father has never bothered to get to know him.  His grandfather he said died when he was about 30 years old and he was a good person and he misses him.  He is close to his grandmother.  PREVIOUS PSYCHIATRIC TREATMENT:  He was in this hospital when it was [facility] two years ago.  He has been in [facility] on one occasion and he has been in outpatient therapy since 1992 at the mental health centers.  MEDICATIONS/ALLERGIES/MEDICAL PROBLEMS:  He denied any medical problems.  No known allergies.  He is a bit overweight.  He takes Neurontin and _________ and Luvox.  LEGAL AND SUBSTANCE ABUSE ISSUES:  He denied any legal problems and  denies any substance abuse.  He does smoke cigarettes he says.  MENTAL STATUS:  Mental status at the time of the initial evaluation revealed an alert, oriented young man who came to the interview willingly and was cooperative.  He was hard to understand because he slurred his speech sometimes and talked slowly.  He denied any suicidal thoughts.  He denied any threats towards anybody else.  He admitted to running away from home and group home because he did not want to follow the rules or did not like the people. There was no evidence of any psychotic thinking.  He has a history of seeing hallucinations and hearing voices, but he is not admitting to either at the moment.  There was no other evidence of any thought disorder.  Short and long term memory appeared intact based on his ability to recall recent and remote events in his own life.  Judgment currently seemed impaired by his not thinking of the consequences before he acts.  Insight was minimal. Intellectual  functioning is reportedly mild MR.  Concentration was adequate for the one-to-one interview.  PATIENT ASSETS:  Aris is cooperative.  ADMITTING DIAGNOSES:   AXIS I:  1. Adjustment disorder with mixed disturbance of _________ and               emotions.            2. Tourettes syndrome by history.  AXIS II:  Mild mental retardation. AXIS III:  1. Sleep apnea by history.            2. Overweight.  AXIS IV:  Severe.   AXIS V:  45/55.  INITIAL TREATMENT PLAN:  Estimated length of hospitalization is 3-5 days. Plan is to stabilize to the point of having a better plan for dealing with his stresses better.  At this point medications will be continued as ordered by his outpatient doctor. DD:  02/01/00 TD:  02/02/00 Job: [id number]

## 2011-02-18 NOTE — Discharge Summary (Signed)
[facility]  Patient:    Bryan Cummings, Bryan Cummings                       MRN: [id number] Adm. Date:  [id number] Disc. Date: [id number] Attending:  Milford Cage H                           Discharge Summary  IDENTIFYING DATA:  Patient is a black male aged 30.  Reason for admission: This 30 year old black male was admitted for inpatient psychiatric stabilization because of increasing symptoms of psychosis as well as patient having altered mood and increasing problems with impulse control.  He on admission was reportedly hearing muffled voices and ran away from his group home after threatening a peer with harm.  HISTORY OF PRESENT ILLNESS:  The patient on admission admitted to having an altercation in his group home.  He apparently is newly residing in this group home, developed an altercation with one of the staff members after getting into an argument with one of his peers in that home.  Apparently, the staff member confronted the patient quite heavily and when the patient continued to be somewhat oppositional with regard to the consequences of his acting, he was given additional consequences by the staff member.  The patient viewed this as being increasingly punitive.  He became increasingly enraged, started hearing voices, was having difficulty controlling his thoughts as well as his rage. He at point in time eloped from the group home, fearing that he would harm either the staff member or the peer that he had gotten into an altercation with.  He was then picked up by his DSS worker, as he was increasingly agitated and depressed and unable to regain his composure at the group home. He at that time continued to feel like he would harm his peer and staff members and consequently inpatient hospitalization was decided to be the least restrictive alternative.  PAST PSYCHIATRIC HISTORY:  Significant for a long-standing history of Oppositional-defiant disorder as well  as Tourettes.  He also has been on antipsychotic medication for an extensive period of time, possibly secondary to either schizoaffective disorder or bipolar disorder of mixed type with exacerbations of psychosis.  PHYSICAL EXAMINATION:  At the time of admission, his physical examination was entirely unremarkable with the exception of his being moderately overweight. At the time of admission, the patient was taking Luvox 100 mg p.o. qhs, Neurontin 600 mg p.o. b.i.d., and 1200 mg p.o. q.h.s., and Risperdal 1 mg p.o. q.a.m. and 3 mg p.o. q.h.s.  ADMITTING DIAGNOSES: Axis I:    1. Oppositional-defiant disorder.            2. Tourettes disorder.            3. Rule out psychotic disorder not otherwise specified Axis II:   Borderline intellectual functioning versus mild mental            retardation. Axis III:  Overweight with a questionable history of sleep apnea. Axis IV:   Current psychosocial stressors are severe. Axis V:    At time of admission code 25-30, highest level of functioning            within past year 50-60.  FINDINGS AT TIME OF ADMISSION:  Patient underwent laboratory work-up to rule out any medical problems contributing to his symptomatology.  This was positive for marijuana noted on urine drug screen.  A CBC was unremarkable.  Metabolic profile was unremarkable.  Urine drug screen on admission was initially negative.  The patients urinalysis was within normal limits. Thyroid function tests are pending at the time of discharge.  HOSPITAL COURSE:  Patient was restarted on his outpatient medications and tolerated them well.  Psychotherapy focused on improving the patients reality testing, impulse control, as well as decreased cognitive distortions and improving his reality testing.  There were no complications over the course of this hospitalization.  The patient required no seclusion or restraints.  No consultations were obtained during the course of this hospitalization.   At the time of discharge, the patient denied any suicidal or homicidal ideation.  He is displaying no evidence of a thought disorder at this time, and he is stable on his medications.  He is denying any side effects from his medication.  His affect and mood are euthymic.  He denies wanting to harm himself or others. He is able to perform his activities of daily living to the extent that it is felt that a less restrictive alternative is indicated for this patient and he will be discharged back to his group home.  POST HOSPITAL CARE PLANS:  The patient will be discharged to the care of his DSS worker and will be transported back to his group home.  DISCHARGE MEDICATIONS: 1. Neurontin 600 mg p.o. b.i.d. and 1200 mg q.h.s. 2. Luvox 100 mg p.o. q.h.s. 3. Risperdal 1 mg q.a.m. and 3 mg q.h.s.  After care plans will include the patient following up with his outpatient psychiatrist Dr. Chestine Spore at [facility] as well as his outpatient therapist at [facility] for individual and possibly group therapy.  The patient is discharged on no dietary restrictions and no restrictions on his activity.  His outpatient therapy will continue to focus on improving his impulse control, reality testing, and addressing any chemical dependency issues.  The patient is denying any chemical dependency problems, but has been discovered to be positive for marijuana on drug screen.  FINAL DIAGNOSIS: Axis I:    1. Tourettes disorder.            2. Oppositional-defiant disorder.            3. Schizoaffective disorder possible.            4. Bipolar disorder, mixed, severe, with mood congruent psychosis               possible.            5. Cannabis abuse, rule out dependence.            6. History of nicotine abuse, rule out dependence. Axis II:   Mild mental retardation versus borderline intellectual functioning. Axis III:  Questionable history of sleep apnea,  overweight. Axis IV:   Current psychosocial stressors are severe. Axis V:    At time of admission, code 25-30, at time of discharge code 50-60. Axis III: DD:  03/22/00 TD:  03/23/00  Job: [id number]

## 2011-02-18 NOTE — H&P (Signed)
[facility]  Patient:    Bryan Cummings, Bryan Cummings                       MRN: [id number] Adm. Date:  [id number] Attending:  Veneta Penton                   Psychiatric Admission Assessment  REASON FOR ADMISSION:  This 30 year old black male was admitted involuntarily complaining of depression increasing over the past 2-[redacted] weeks along with suicidal ideation with a plan to electrocute himself and homicidal ideation with a plan to kill a peer in his group home.  HISTORY OF PRESENT ILLNESS:  The patient reports an increasingly depressed, irritable, angry and agitated mood over the past several weeks.  He reports that he has been developing increasingly disorganized thoughts.  He reports that one of the peers in his group home have been picking on him and, yesterday, the two of them began arguing.  The patient stated that he planned on killing the other patient, knocked the door down to attempt to get at that peer and kill him.  He was felt to be psychotic and agitated.  The patient admitted to a depressed, irritable and angry mood.  Anhedonia.  His school performance is decreased.  He is giving up on activities previously found pleasurable.  He admits to insomnia, weight gain, psychomotor agitation, feelings of hopelessness, helplessness, worthlessness, decreased concentration and energy level, increased symptoms of fatigue.  He reports his current psychosocial stressors include the fact that he turned 65 on January 21, 2001 and now feels that he has added responsibilities because he is now legally an adult.  PAST PSYCHIATRIC HISTORY:  Longstanding history of schizoaffective disorder and conduct disorder.  He has had multiple previous inpatient hospitalizations including to [facility] in February 2002, being his most recent hospitalization.  Prior to that, he was hospitalized in August, July, June and April of 2001.  He has at least nine or  10 previous hospitalizations.  He was hospitalized at [facility] three years ago on two different occasions and hospitalized in [place] twice in the past at an unknown hospital.  He has been seen in outpatient therapy by Dr. Chestine Spore at [facility] for the past several years.  DRUG AND ALCOHOL ABUSE HISTORY:  The patient smokes one pack of cigarettes per day.  He reports using cannabis whenever it is available.  PAST MEDICAL HISTORY:  Obesity and acne vulgaris.  He denies any other medical or surgical problems.  ALLERGIES:  He is allergic to CHOCOLATE and PENICILLIN.  He otherwise denies any allergies or drug sensitivities.  CURRENT MEDICATIONS:  Geodon 160 mg p.o. q.h.s., Luvox 100 mg p.o. q.h.s., Neurontin 900 mg p.o. t.i.d., _________ 100 mg p.o. q.d.  FAMILY/SOCIAL HISTORY:  Mother is a schizophrenic.  The patient has been in DSS custody up until age 41.  He is well-connected into the DSS system.  He lives at the [facility].  He is in the 11th grade in a self-contained class.  STRENGTHS AND ASSETS:  He is well-connected into the social services system.  MENTAL STATUS EXAMINATION:  The patient presents as a well-developed, well-nourished, obese black male who is alert and oriented x 4.  Cooperative with the evaluation.  Appearance is compatible with his stated age.  He is psychomotor agitated with a furrowed brow.  His affect and mood are flat, blunted, depressed and  irritable.  He displays magical thinking and thought insertion.  His concentration is decreased.  His activities of daily living are poor.  His insight is poor.  Judgment is poor.  Intelligence appears to be somewhat below average.  Similarities and differences are within normal limits His proverbs are concrete and consistent with his history of a thought disorder.  His thought processes, at the present time, are somewhat goal directed.  His immediate recall, short-term  memory and remote memory appear to be intact.  DIAGNOSES:  (According to DSM-IV). Axis I:    1. Schizoaffective disorder.            2. Conduct disorder.            3. Cannabis and nicotine dependence. Axis II:   Rule out learning disorder not otherwise specified. Axis III:  1. Obesity.            2. Acne vulgaris. Axis IV:   Current psychosocial stressors are severe. Axis V:    10-20.  ESTIMATED LENGTH OF STAY:  Five to seven days.  INITIAL DISCHARGE PLAN:  Discharge the patient back to the custody of his group home.  INITIAL PLAN OF CARE:  Continue the patient on Geodon, Luvox and Neurontin and observe if further adjustments in medication are needed.  Psychotherapy will focus on decreasing the patients potential for self-harm and harm to others as well as decreasing cognitive distortions, improving his reality testing and impulse control.  A laboratory workup will also be initiated to rule out any medical problems contributing to his symptomatology. DD:  01/25/01 TD:  01/26/01 Job: [id number]

## 2011-02-18 NOTE — H&P (Signed)
[facility]  Patient:    Bryan Cummings, NEIDIG                       MRN: [id number] Adm. Date:  [id number] Attending:  Veneta Penton                   Psychiatric Admission Assessment  REASON FOR ADMISSION:  This 30 year old black male was admitted to [facility] after being discharged from this unit approximately two weeks prior to this admission.  The patient had been discharged to a group home, where he reportedly had increasing symptoms of depression and agitation, decreased ability to perform his activities of daily living, and was unable to participate in the program there.  He admitted to increasing problems with anhedonia, insomnia, weight gain, feelings of hopelessness, helplessness, worthlessness, decreased concentration and energy level, and recurring thoughts of death.  He began banging his head against the wall as a suicide attempt, and had destroyed furniture in the group home on the day of admission and was threatening to kill staff at the group home.  He also reported feelings that staff at the group home were out to get him and he admitted to several occasions of threatening to run away prior to the incident on the date of admission.  He also admitted to magical thinking and feeling that thoughts were being placed in his head by an outside source.  He had been threatening to kill peers in his group home over the past several days and admitted to the homicidal ideation of planning to kill members of the group home and peers by stabbing with a knife, which he had access to.  He denied any symptoms of mania.  He denied any hallucinations.  He did admit to delusional symptoms as described above.  He denies any obsessions, compulsions, or panic attacks.  He did admit to using cannabis, but refused to discuss the amount that he is using.  He admits to smoking 1-2 packs of cigarettes per day.  He denied any other alcohol  or drug use.  PAST PSYCHIATRIC HISTORY:  Significant for multiple previous [facility] admissions, the last being two weeks ago, as described above.  The patient is currently in DSS custody, where he has been living at a group home at [facility].  He has a history of cruelty to animals, stealing, and destruction of property.  PAST MEDICAL HISTORY:  He has a past history of obesity and a sleep-related breathing disorder, for which he is currently treatment.  FAMILY HISTORY:  Significant for his mother having a history of schizophrenia. His fathers whereabouts are unknown.  ALLERGIES:  The patient has no known drug allergies or sensitivities.  MEDICATIONS:  At the time of admission include: 1. Risperdal 1 mg in the morning and 3 mg q.h.s. 2. Neurontin 600 mg p.o. t.i.d. 3. Luvox 100 mg p.o. q.d.  The patients history for growth and development is remarkable for his having been diagnosed as having mild mental retardation in the past.  He, otherwise, has no available history of problems with his growth and development.  MENTAL STATUS EXAMINATION:  The patient presents as a well-developed, obese, black male, who is alert and oriented x 4, hypervigilant, suspicious, and guarded, hyperactive, psychomotor agitated, disheveled, unkempt, and his appearance is compatible with his stated age.  His speech is pressured with occasional looseness of associations.  His thoughts are disorganized and delusional in content.  His insight is poor.  His judgment is poor. Similarities are within normal limits.  He is unable to do differences and his proverbs are concrete.  His immediate recall, short-term memory, and remote memory appear to be grossly intact and consistent with his intellectual level. He admits to suicidal ideation, wanting to kill himself by banging his head against the wall or cutting himself, and homicidal ideation as described above.  ASSETS AND STRENGTHS:  He is well connected  into the DSS system.  INITIAL PLAN OF CARE:  To have psychotherapy focus on decreasing the patients potential for harm to self and others, increasing his activities of daily living and reality testing, as well as his impulse control.  He will restart his outpatient medication.  ESTIMATED LENGTH OF STAY:  5-7 days.  INITIAL DISCHARGE PLAN:  Place the patient back into a group home.  ADMITTING DIAGNOSIS:  According to DSM-4: Axis I:    1. Schizoaffective disorder, subchronic, with acute exacerbation.            2. Rule out schizophrenia, paranoid type, subchronic with acute               exacerbation. Axis II:   Mild mental retardation. Axis III:  Obesity, sleep-related breathing disorder. Axis IV:   Current psychosocial stressors, severe. Axis V:    15. DD:  03/30/00 TD:  03/30/00 Job: [id number]

## 2011-02-18 NOTE — H&P (Signed)
[facility]  Patient:    Bryan Cummings, Bryan Cummings                       MRN: [id number] Adm. Date:  [id number] Attending:  Veneta Penton                   Psychiatric Admission Assessment  CHIEF COMPLAINT:  Bryan Cummings is a 30 year old male.  He was admitted to the hospital for running away from his group home and apparently making threats towards himself and others.  HISTORY OF PRESENT ILLNESS:  Bryan Cummings denies any threats toward himself or anyone else.  He admitted to running away from the group home and as usual, claims he uses drugs and has sex when he is out.  He came back on his own.  He has had many hospitalizations in the past and has been here n several of those occasions.  He has been hospitalized, this makes the seventh time, that I am aware of.  His basic premise is that he does not want to be in the group home. He wants to be independent and live with his family and that is what he is going to do no matter what anybody says.  His family does not want him at home right now because he does not follow their rules either and does as he pleases there also.  Consequently, he leaves the group home when he wants to, comes back when he feels like it.  When he wants to be in the hospital, he will make threats toward himself or anyone else.  He also can be well-behaved and follow the rules when he chooses.  PREVIOUS PSYCHIATRIC TREATMENT:  As mentioned above.  This is his seventh hospitalization, all for very similar reasons.  He is in outpatient treatment at [facility] where he lives and takes multiple medications.  DRUG, ALCOHOL AND LEGAL ISSUES:  He has no current legal problems.  He says he drinks alcohol and smokes pot sometimes.  PAST MEDICAL HISTORY:  He is overweight.  ALLERGIES:  No known drug allergies.  MEDICATIONS:  He is currently taking Luvox, Risperdal, Neurontin, and clonazepam.  MENTAL STATUS EXAMINATION:  Mental status at the  time of this evaluation revealed essentially the same person.  He is alert, oriented, cooperative, pleasant, friendly, denies any suicidal thoughts or threats towards anyone else.  There is no evidence of any psychotic thoughts or behavior.  Insight is lacking.  Intellectual functioning does not appear to be limited in spite of history of mild mental retardation.  Concentration is adequate.  Memory seems intact.  PATIENT ASSETS:  Bryan Cummings is comfortable and familiar in the hospital.  ADMITTING DIAGNOSES: Axis I:    1. Schizoaffective disorder.            2. Tourettes disorder.            3. Conduct disorder, childhood onset. Axis II:   Mild mental retardation. Axis III:  1. Sleep apnea by history.            2. Overweight. Axis IV:   Moderate. Axis V:    45/50.  INITIAL TREATMENT PLAN:  The estimated length of hospitalization is about three days.  Plan is to stabilize once again and to continue working on him taking some responsibility for his behavior.  Medications will be continued. Dr. Haynes Hoehn will be his attending. DD:  05/13/00 TD:  05/14/00 Job: [id number]

## 2011-07-08 LAB — POCT H PYLORI SCREEN: H. PYLORI SCREEN, POC: NEGATIVE

## 2012-03-26 ENCOUNTER — Encounter (HOSPITAL_COMMUNITY): Payer: Self-pay

## 2012-03-26 ENCOUNTER — Emergency Department (INDEPENDENT_AMBULATORY_CARE_PROVIDER_SITE_OTHER)
Admission: EM | Admit: 2012-03-26 | Discharge: 2012-03-26 | Disposition: A | Discharge status: Home / Self Care | Payer: Medicare Other | Source: Home / Self Care

## 2012-03-26 DIAGNOSIS — L0292 Furuncle, unspecified: Secondary | ICD-10-CM

## 2012-03-26 HISTORY — DX: Obsessive-compulsive disorder, unspecified: F42.9

## 2012-03-26 HISTORY — DX: Tourette's disorder: F95.2

## 2012-03-26 HISTORY — DX: Unspecified asthma, uncomplicated: J45.909

## 2012-03-26 MED ORDER — DOXYCYCLINE HYCLATE 100 MG PO TABS: 100.0000 mg | ORAL_TABLET | Freq: Two times a day (BID) | ORAL | Status: AC

## 2012-03-26 MED ORDER — TRAMADOL HCL 50 MG PO TABS: 50.0000 mg | ORAL_TABLET | Freq: Four times a day (QID) | ORAL | Status: AC | PRN

## 2012-03-26 NOTE — Discharge Instructions (Signed)

## 2012-03-26 NOTE — ED Notes (Signed)
C/o 2 warts to palm of lt hand for weeks.  States using compound W but they are getting bigger.  States the one located at base of lt thumb is painful.  Also states he had boil to his lt ear but it has drained and scabbed over- is no longer bothering him.

## 2012-03-26 NOTE — ED Provider Notes (Signed)
History     CSN: [id number]  Arrival date & time 03/26/12  1140   First MD Initiated Contact with Patient 03/26/12 1143      Chief Complaint  Patient presents with  . Verrucous Vulgaris  . Recurrent Skin Infections    (Consider location/radiation/quality/duration/timing/severity/associated sxs/prior treatment) HPI  Patient is a 31 year old male who presents with a boil on external side of left ear, approximately half a centimeter in diameter, patient reports that it had pus coming out of it and now it has stopped. He has history of recurrent boils and requires antibiotic treatment for it. Patient denies any pain at the area, denies noticing any lumps or nodules anywhere on the skin, he is not aware of boils on the other parts of the body. Patient reports subjective fevers and chills for approximately one week, denies any chest pain or shortness of breath, no visual changes, no bleeding from the ear, no pus drainage from the ears.  Past Medical History  Diagnosis Date  . Asthma   . Tourette's disease   . OCD (obsessive compulsive disorder)     History reviewed. No pertinent past surgical history.  No family history on file.  History  Substance Use Topics  . Smoking status: Current Everyday Smoker -- 0.5 packs/day  . Smokeless tobacco: Not on file  . Alcohol Use: Yes      Review of Systems Review of Systems  Constitutional: Negative for fever, chills, diaphoresis, activity change, appetite change and fatigue.  HENT: Negative for ear pain, nosebleeds, congestion, facial swelling, rhinorrhea, neck pain, neck stiffness and ear discharge.   Eyes: Negative for pain, discharge, redness, itching and visual disturbance.  Respiratory: Negative for cough, choking, chest tightness, shortness of breath, wheezing and stridor.   Cardiovascular: Negative for chest pain, palpitations and leg swelling.  Gastrointestinal: Negative for abdominal distention.  Genitourinary: Negative for  dysuria, urgency, frequency, hematuria, flank pain, decreased urine volume, difficulty urinating and dyspareunia.  Musculoskeletal: Negative for back pain, joint swelling, arthralgias and gait problem.  Neurological: Negative for dizziness, tremors, seizures, syncope, facial asymmetry, speech difficulty, weakness, light-headedness, numbness and headaches.  Hematological: Negative for adenopathy. Does not bruise/bleed easily.  Psychiatric/Behavioral: Negative for hallucinations, behavioral problems, confusion, dysphoric mood, decreased concentration and agitation.    Allergies  Review of patient's allergies indicates no known allergies.  Home Medications   Current Outpatient Rx  Name Route Sig Dispense Refill  . BENZTROPINE MESYLATE PO Oral Take 1 mg by mouth.    Marland Kitchen DIVALPROEX SODIUM PO Oral Take 250 mg by mouth.    Marland Kitchen HALOPERIDOL 10 MG PO TABS Oral Take 10 mg by mouth daily.    . TRAZODONE HCL 100 MG PO TABS Oral Take 100 mg by mouth at bedtime.    Marland Kitchen DOXYCYCLINE HYCLATE 100 MG PO TABS Oral Take 1 tablet (100 mg total) by mouth 2 (two) times daily. 14 tablet 0  . TRAMADOL HCL 50 MG PO TABS Oral Take 1 tablet (50 mg total) by mouth every 6 (six) hours as needed for pain. 30 tablet 0    BP 136/88  Pulse 79  Temp 98.2 F (36.8 C) (Oral)  Resp 18  SpO2 100%  Physical Exam Physical Exam  Constitutional: appears well-developed and well-nourished. No distress.  HENT:  Head: Normocephalic.  Right Ear: External ear normal.  Left Ear: External ear normal. On tragus area small half centimeter diameter boil noted, unable to squeeze pus out of it, slightly warm to touch with mild erythema  around it Nose: Nose normal.  Mouth/Throat: Oropharynx is clear and moist.  Eyes: Conjunctivae and EOM are normal. Pupils are equal, round, and reactive to light. Right eye exhibits no discharge. Left eye exhibits no discharge. No scleral icterus.  Neck: Normal range of motion. Neck supple. No JVD present. No  tracheal deviation present. No thyromegaly present.  Cardiovascular: Normal rate, regular rhythm, normal heart sounds and intact distal pulses.  Exam reveals no gallop and no friction rub.   Lymphadenopathy: no cervical adenopathy.     ED Course  Procedures (including critical care time)  Labs Reviewed - No data to display   1. Boil    - Patient is morbidly obese and at high risk for chronic condition hidradenitis suppurativa given recurrent boils and infection of boils - Unable to squeeze any pus from the area, but as advised patient to apply warm compresses as needed - This is already getting better per patient and will therefore prescribe a course of antibiotic - Patient was advised if his symptoms get worse he needs to see primary care doctor   MDM    Boil - needs treatment with Doxycycline       Dorothea Ogle, MD 03/26/12 1335

## 2012-08-24 ENCOUNTER — Encounter (HOSPITAL_COMMUNITY): Admission: EM | Disposition: A | Discharge status: Home / Self Care | Payer: Self-pay | Source: Home / Self Care

## 2012-08-24 ENCOUNTER — Inpatient Hospital Stay: Admit: 2012-08-24 | Payer: Self-pay | Admitting: Orthopedic Surgery

## 2012-08-24 ENCOUNTER — Emergency Department (INDEPENDENT_AMBULATORY_CARE_PROVIDER_SITE_OTHER): Payer: Medicare Other

## 2012-08-24 ENCOUNTER — Observation Stay (HOSPITAL_COMMUNITY)
Admission: EM | Admit: 2012-08-24 | Discharge: 2012-08-26 | Disposition: A | Discharge status: Home / Self Care | Payer: Medicare Other | Attending: Orthopedic Surgery | Admitting: Orthopedic Surgery

## 2012-08-24 ENCOUNTER — Encounter (HOSPITAL_COMMUNITY): Payer: Self-pay | Admitting: Certified Registered Nurse Anesthetist

## 2012-08-24 ENCOUNTER — Encounter (HOSPITAL_COMMUNITY): Payer: Self-pay | Admitting: Emergency Medicine

## 2012-08-24 ENCOUNTER — Encounter (HOSPITAL_COMMUNITY): Payer: Self-pay | Admitting: *Deleted

## 2012-08-24 ENCOUNTER — Emergency Department (INDEPENDENT_AMBULATORY_CARE_PROVIDER_SITE_OTHER)
Admission: EM | Admit: 2012-08-24 | Discharge: 2012-08-24 | Disposition: A | Discharge status: Nursing Home (Includes ICF) | Payer: Medicare Other | Source: Home / Self Care | Attending: Emergency Medicine | Admitting: Emergency Medicine

## 2012-08-24 DIAGNOSIS — M6510 Other infective (teno)synovitis, unspecified site: Secondary | ICD-10-CM

## 2012-08-24 DIAGNOSIS — M659 Synovitis and tenosynovitis, unspecified: Secondary | ICD-10-CM

## 2012-08-24 DIAGNOSIS — F429 Obsessive-compulsive disorder, unspecified: Secondary | ICD-10-CM | POA: Insufficient documentation

## 2012-08-24 DIAGNOSIS — F1210 Cannabis abuse, uncomplicated: Secondary | ICD-10-CM | POA: Insufficient documentation

## 2012-08-24 DIAGNOSIS — F17200 Nicotine dependence, unspecified, uncomplicated: Secondary | ICD-10-CM | POA: Insufficient documentation

## 2012-08-24 DIAGNOSIS — F952 Tourette's disorder: Secondary | ICD-10-CM | POA: Insufficient documentation

## 2012-08-24 DIAGNOSIS — L03019 Cellulitis of unspecified finger: Principal | ICD-10-CM | POA: Insufficient documentation

## 2012-08-24 DIAGNOSIS — T24239A Burn of second degree of unspecified lower leg, initial encounter: Secondary | ICD-10-CM

## 2012-08-24 DIAGNOSIS — L02519 Cutaneous abscess of unspecified hand: Principal | ICD-10-CM | POA: Insufficient documentation

## 2012-08-24 DIAGNOSIS — J45909 Unspecified asthma, uncomplicated: Secondary | ICD-10-CM | POA: Insufficient documentation

## 2012-08-24 DIAGNOSIS — Z23 Encounter for immunization: Secondary | ICD-10-CM

## 2012-08-24 DIAGNOSIS — L02512 Cutaneous abscess of left hand: Secondary | ICD-10-CM

## 2012-08-24 HISTORY — PX: I&D EXTREMITY: SHX5045

## 2012-08-24 LAB — CBC WITH DIFFERENTIAL/PLATELET
Basophils Absolute: 0 10*3/uL (ref 0.0–0.1)
Basophils Relative: 0 % (ref 0–1)
HCT: 44.2 % (ref 39.0–52.0)
Hemoglobin: 15.7 g/dL (ref 13.0–17.0)
Lymphocytes Relative: 21 % (ref 12–46)
MCHC: 35.5 g/dL (ref 30.0–36.0)
Monocytes Absolute: 1.3 10*3/uL — ABNORMAL HIGH (ref 0.1–1.0)
Monocytes Relative: 11 % (ref 3–12)
Neutro Abs: 7.3 10*3/uL (ref 1.7–7.7)
Neutrophils Relative %: 66 % (ref 43–77)
RDW: 15 % (ref 11.5–15.5)
WBC: 11.1 10*3/uL — ABNORMAL HIGH (ref 4.0–10.5)

## 2012-08-24 LAB — BASIC METABOLIC PANEL
CO2: 28 mEq/L (ref 19–32)
Chloride: 98 mEq/L (ref 96–112)
GFR calc Af Amer: >90 mL/min (ref >90)
Potassium: 4.9 mEq/L (ref 3.5–5.1)

## 2012-08-24 SURGERY — IRRIGATION AND DEBRIDEMENT EXTREMITY
Anesthesia: General | Site: Hand | Laterality: Left | Wound class: Dirty or Infected

## 2012-08-24 MED ORDER — SILVER SULFADIAZINE 1 % EX CREA: TOPICAL_CREAM | Freq: Every day | CUTANEOUS | Status: DC

## 2012-08-24 MED ORDER — TETANUS-DIPHTH-ACELL PERTUSSIS 5-2.5-18.5 LF-MCG/0.5 IM SUSP
0.5000 mL | Freq: Once | INTRAMUSCULAR | Status: AC
  Administered 2012-08-24: 0.5 mL via INTRAMUSCULAR

## 2012-08-24 MED ORDER — TETANUS-DIPHTH-ACELL PERTUSSIS 5-2.5-18.5 LF-MCG/0.5 IM SUSP
INTRAMUSCULAR | Status: AC
  Filled 2012-08-24: qty 0.5

## 2012-08-24 SURGICAL SUPPLY — 46 items
BANDAGE CONFORM 2  STR LF (GAUZE/BANDAGES/DRESSINGS) IMPLANT
BANDAGE ELASTIC 4 VELCRO ST LF (GAUZE/BANDAGES/DRESSINGS) IMPLANT
BANDAGE GAUZE ELAST BULKY 4 IN (GAUZE/BANDAGES/DRESSINGS) IMPLANT
CLOTH BEACON ORANGE TIMEOUT ST (SAFETY) ×2 IMPLANT
CORDS BIPOLAR (ELECTRODE) ×2 IMPLANT
CUFF TOURNIQUET SINGLE 18IN (TOURNIQUET CUFF) IMPLANT
CUFF TOURNIQUET SINGLE 24IN (TOURNIQUET CUFF) ×2 IMPLANT
CUFF TOURNIQUET SINGLE 34IN LL (TOURNIQUET CUFF) IMPLANT
CUFF TOURNIQUET SINGLE 44IN (TOURNIQUET CUFF) IMPLANT
DRSG ADAPTIC 3X8 NADH LF (GAUZE/BANDAGES/DRESSINGS) IMPLANT
DRSG EMULSION OIL 3X3 NADH (GAUZE/BANDAGES/DRESSINGS) ×2 IMPLANT
ELECT REM PT RETURN 9FT ADLT (ELECTROSURGICAL)
ELECTRODE REM PT RTRN 9FT ADLT (ELECTROSURGICAL) IMPLANT
GAUZE XEROFORM 1X8 LF (GAUZE/BANDAGES/DRESSINGS) IMPLANT
GLOVE BIOGEL M STRL SZ7.5 (GLOVE) ×2 IMPLANT
GLOVE BIOGEL PI IND STRL 6.5 (GLOVE) ×1 IMPLANT
GLOVE BIOGEL PI INDICATOR 6.5 (GLOVE) ×1
GLOVE ECLIPSE 7.5 STRL STRAW (GLOVE) ×2 IMPLANT
GLOVE SS BIOGEL STRL SZ 8 (GLOVE) ×1 IMPLANT
GLOVE SUPERSENSE BIOGEL SZ 8 (GLOVE) ×1
GOWN PREVENTION PLUS XLARGE (GOWN DISPOSABLE) IMPLANT
GOWN STRL NON-REIN LRG LVL3 (GOWN DISPOSABLE) IMPLANT
GOWN STRL REIN XL XLG (GOWN DISPOSABLE) ×2 IMPLANT
HANDPIECE INTERPULSE COAX TIP (DISPOSABLE)
KIT BASIN OR (CUSTOM PROCEDURE TRAY) ×2 IMPLANT
KIT ROOM TURNOVER OR (KITS) ×2 IMPLANT
LOOP VESSEL MAXI BLUE (MISCELLANEOUS) ×2 IMPLANT
MANIFOLD NEPTUNE II (INSTRUMENTS) ×2 IMPLANT
NEEDLE HYPO 25GX1X1/2 BEV (NEEDLE) IMPLANT
NS IRRIG 1000ML POUR BTL (IV SOLUTION) ×2 IMPLANT
PACK ORTHO EXTREMITY (CUSTOM PROCEDURE TRAY) ×2 IMPLANT
PAD ARMBOARD 7.5X6 YLW CONV (MISCELLANEOUS) ×4 IMPLANT
PAD CAST 4YDX4 CTTN HI CHSV (CAST SUPPLIES) IMPLANT
PADDING CAST COTTON 4X4 STRL (CAST SUPPLIES)
SET CYSTO W/LG BORE CLAMP LF (SET/KITS/TRAYS/PACK) ×2 IMPLANT
SET HNDPC FAN SPRY TIP SCT (DISPOSABLE) IMPLANT
SPONGE GAUZE 4X4 12PLY (GAUZE/BANDAGES/DRESSINGS) IMPLANT
SPONGE LAP 18X18 X RAY DECT (DISPOSABLE) ×2 IMPLANT
SPONGE LAP 4X18 X RAY DECT (DISPOSABLE) IMPLANT
SYR CONTROL 10ML LL (SYRINGE) IMPLANT
TOWEL OR 17X24 6PK STRL BLUE (TOWEL DISPOSABLE) ×4 IMPLANT
TOWEL OR 17X26 10 PK STRL BLUE (TOWEL DISPOSABLE) ×2 IMPLANT
TUBE ANAEROBIC SPECIMEN COL (MISCELLANEOUS) IMPLANT
TUBE CONNECTING 12X1/4 (SUCTIONS) ×2 IMPLANT
WATER STERILE IRR 1000ML POUR (IV SOLUTION) ×2 IMPLANT
YANKAUER SUCT BULB TIP NO VENT (SUCTIONS) ×2 IMPLANT

## 2012-08-24 NOTE — ED Notes (Signed)
Attempted to start PIV x 2 by 2 RNs with no success. Paged IV Team for an PIV start

## 2012-08-24 NOTE — ED Notes (Signed)
Family at bedside. 
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
## 2012-08-24 NOTE — ED Provider Notes (Signed)
MSE was initiated and I personally evaluated the patient and placed orders (if any) at  5:57 PM on August 24, 2012.  The patient appears stable so that the remainder of the MSE may be completed by another provider.  Pt sent here from Urgent care to be evaluated by Dr Amanda Pea for the evaluation of possible tenosynovitis.  Pt in no distress.  Will add on CBC, BMET and start an IV.  Will contact Dr Allegra Grana, MD 08/24/12 1758

## 2012-08-24 NOTE — H&P (Signed)
Bryan Cummings is an 31 y.o. male.   Chief Complaint: Infection left hand  HPI: Patient presents with a greater than one week history of infection in the left hand. He notes that he crushed the middle finger a week ago or so. Prior to that episode he was doing well. He is progressively had swelling loss of function loss of motion and now presents with a swollen painful finger with inability to move he has a callus over the finger but denies an occupation. The patient notes no locking popping or catching.  He notes no neck back chest or abdominal pain he is here today with his family member. Patient has been referred for hand surgical care and surgical consult.  Past Medical History  Diagnosis Date  . Asthma   . Tourette's disease   . OCD (obsessive compulsive disorder)     No past surgical history on file.  No family history on file. Social History:  reports that he has been smoking Cigarettes.  He has been smoking about 2 packs per day. He does not have any smokeless tobacco history on file. He reports that he drinks alcohol. He reports that he uses illicit drugs (Marijuana).  Allergies: No Known Allergies   (Not in a hospital admission)  Results for orders placed during the hospital encounter of 08/24/12 (from the past 48 hour(s))  CBC WITH DIFFERENTIAL     Status: Abnormal   Collection Time   08/24/12  6:10 PM      Component Value Range Comment   WBC 11.1 (*) 4.0 - 10.5 K/uL    RBC 5.03  4.22 - 5.81 MIL/uL    Hemoglobin 15.7  13.0 - 17.0 g/dL    HCT 95.6  21.3 - 08.6 %    MCV 87.9  78.0 - 100.0 fL    MCH 31.2  26.0 - 34.0 pg    MCHC 35.5  30.0 - 36.0 g/dL    RDW 57.8  46.9 - 62.9 %    Platelets 222  150 - 400 K/uL    Neutrophils Relative 66  43 - 77 %    Neutro Abs 7.3  1.7 - 7.7 K/uL    Lymphocytes Relative 21  12 - 46 %    Lymphs Abs 2.3  0.7 - 4.0 K/uL    Monocytes Relative 11  3 - 12 %    Monocytes Absolute 1.3 (*) 0.1 - 1.0 K/uL    Eosinophils Relative 2  0 - 5 %      Eosinophils Absolute 0.2  0.0 - 0.7 K/uL    Basophils Relative 0  0 - 1 %    Basophils Absolute 0.0  0.0 - 0.1 K/uL    Dg Hand Complete Left  08/24/2012  *RADIOLOGY REPORT*  Clinical Data: Left middle finger pain after crush injury.  LEFT HAND - COMPLETE 3+ VIEW  Comparison: None.  Findings: Lateral film limited by superimposition of the fingers. No evidence for fracture.  No subluxation or dislocation.  No worrisome lytic or sclerotic osseous abnormality.  IMPRESSION: No acute bony findings although the lateral projection is limited by superimposition of the fingers.   Original Report Authenticated By: Kennith Center, M.D.     Review of Systems  Constitutional: Negative.   HENT: Negative.   Respiratory: Negative.   Cardiovascular: Negative.   Gastrointestinal: Negative.   Genitourinary: Negative.   Skin: Negative.   Neurological: Negative.   Endo/Heme/Allergies: Negative.     Blood pressure 140/90, pulse 87,  temperature 98.2 F (36.8 C), temperature source Oral, resp. rate 16, SpO2 95.00%. Physical Exam swollen left middle finger with obvious abscess. He has loss of flexion loss of extension and will not move the finger he has no evidence of instability on exam. He does have intact refill and sensation. We have discussed these issues at length. He has full range of motion to the elbow and forearm.Marland KitchenMarland KitchenThe patient is alert and oriented in no acute distress the patient complains of pain in the affected upper extremity. The patient is noted to have a normal HEENT exam. Lung fields show equal chest expansion and no shortness of breath abdomen exam is nontender without distention. Lower extremity examination does not show any fracture dislocation or blood clot symptoms. Pelvis is stable neck and back are stable and nontender  Assessment/Plan .Marland KitchenWe are planning surgery for your upper extremity. The risk and benefits of surgery include risk of bleeding infection anesthesia damage to normal  structures and failure of the surgery to accomplish its intended goals of relieving symptoms and restoring function with this in mind we'll going to proceed. I have specifically discussed with the patient the pre-and postoperative regime and the does and don'ts and risk and benefits in great detail. Risk and benefits of surgery also include risk of dystrophy chronic nerve pain failure of the healing process to go onto completion and other inherent risks of surgery The relavent the pathophysiology of the disease/injury process, as well as the alternatives for treatment and postoperative course of action has been discussed in great detail with the patient who desires to proceed.  We will do everything in our power to help you (the patient) restore function to the upper extremity. Is a pleasure to see this patient today.     Karen Chafe 08/24/2012, 6:40 PM

## 2012-08-24 NOTE — ED Notes (Signed)
IV Team at the bedside. 

## 2012-08-24 NOTE — Anesthesia Preprocedure Evaluation (Addendum)
Anesthesia Evaluation  Patient identified by MRN, date of birth, ID band Patient awake    Reviewed: Allergy & Precautions, H&P , NPO status , Patient's Chart, lab work & pertinent test results, reviewed documented beta blocker date and time   Airway Mallampati: II TM Distance: >3 FB Neck ROM: full    Dental   Pulmonary asthma ,  breath sounds clear to auscultation        Cardiovascular negative cardio ROS  Rhythm:regular     Neuro/Psych PSYCHIATRIC DISORDERS negative neurological ROS     GI/Hepatic negative GI ROS, Neg liver ROS,   Endo/Other  Morbid obesity  Renal/GU negative Renal ROS  negative genitourinary   Musculoskeletal   Abdominal   Peds  Hematology negative hematology ROS (+)   Anesthesia Other Findings See surgeon's H&P   Reproductive/Obstetrics negative OB ROS                          Anesthesia Physical Anesthesia Plan  ASA: III and emergent  Anesthesia Plan: General   Post-op Pain Management:    Induction: Intravenous  Airway Management Planned: Oral ETT  Additional Equipment:   Intra-op Plan:   Post-operative Plan: Extubation in OR  Informed Consent: I have reviewed the patients History and Physical, chart, labs and discussed the procedure including the risks, benefits and alternatives for the proposed anesthesia with the patient or authorized representative who has indicated his/her understanding and acceptance.   Dental Advisory Given  Plan Discussed with: CRNA and Surgeon  Anesthesia Plan Comments:        Anesthesia Quick Evaluation
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
## 2012-08-24 NOTE — ED Notes (Addendum)
Left middle finger swollen. Cause - slamming hand in front or the wort. Cms intact.

## 2012-08-24 NOTE — ED Notes (Signed)
Spoke with house coverage re: disposition to admit, there is not an order placed for a bed request, pt to go to OR, family & pt updated

## 2012-08-25 ENCOUNTER — Emergency Department (HOSPITAL_COMMUNITY): Payer: Medicare Other | Admitting: Certified Registered Nurse Anesthetist

## 2012-08-25 ENCOUNTER — Encounter (HOSPITAL_COMMUNITY): Payer: Self-pay | Admitting: Certified Registered Nurse Anesthetist

## 2012-08-25 LAB — CBC WITH DIFFERENTIAL/PLATELET
Basophils Absolute: 0 10*3/uL (ref 0.0–0.1)
Eosinophils Relative: 0 % (ref 0–5)
Lymphocytes Relative: 11 % — ABNORMAL LOW (ref 12–46)
Lymphs Abs: 1.6 10*3/uL (ref 0.7–4.0)
MCV: 89.7 fL (ref 78.0–100.0)
Neutro Abs: 11.7 10*3/uL — ABNORMAL HIGH (ref 1.7–7.7)
Platelets: 218 10*3/uL (ref 150–400)
RBC: 5.15 MIL/uL (ref 4.22–5.81)
RDW: 15.3 % (ref 11.5–15.5)
WBC: 15 10*3/uL — ABNORMAL HIGH (ref 4.0–10.5)

## 2012-08-25 MED ORDER — VANCOMYCIN HCL 1000 MG IV SOLR
1500.0000 mg | INTRAVENOUS | Status: AC
  Administered 2012-08-25: 1500 mg via INTRAVENOUS
  Filled 2012-08-25: qty 1500

## 2012-08-25 MED ORDER — VANCOMYCIN HCL 1000 MG IV SOLR
1000.0000 mg | INTRAVENOUS | Status: DC | PRN
  Administered 2012-08-25: 1000 mg via INTRAVENOUS

## 2012-08-25 MED ORDER — DIVALPROEX SODIUM 500 MG PO DR TAB
1250.0000 mg | DELAYED_RELEASE_TABLET | Freq: Every day | ORAL | Status: DC
  Administered 2012-08-25: 1250 mg via ORAL
  Filled 2012-08-25 (×2): qty 1

## 2012-08-25 MED ORDER — METHOCARBAMOL 500 MG PO TABS
500.0000 mg | ORAL_TABLET | Freq: Four times a day (QID) | ORAL | Status: DC | PRN
  Administered 2012-08-25: 500 mg via ORAL
  Filled 2012-08-25 (×2): qty 1

## 2012-08-25 MED ORDER — OXYCODONE HCL 5 MG PO TABS: 5.0000 mg | ORAL_TABLET | Freq: Once | ORAL | Status: DC | PRN

## 2012-08-25 MED ORDER — PIPERACILLIN-TAZOBACTAM 3.375 G IVPB 30 MIN
3.3750 g | Freq: Once | INTRAVENOUS | Status: AC
  Administered 2012-08-25: 3.375 g via INTRAVENOUS
  Filled 2012-08-25: qty 50

## 2012-08-25 MED ORDER — SODIUM CHLORIDE 0.9 % IR SOLN
Status: DC | PRN
  Administered 2012-08-25: 3000 mL

## 2012-08-25 MED ORDER — HYDROMORPHONE HCL PF 1 MG/ML IJ SOLN: 0.2500 mg | INTRAMUSCULAR | Status: DC | PRN

## 2012-08-25 MED ORDER — OXYCODONE HCL 5 MG PO TABS
5.0000 mg | ORAL_TABLET | ORAL | Status: DC | PRN
  Administered 2012-08-25 (×3): 10 mg via ORAL
  Filled 2012-08-25 (×3): qty 2

## 2012-08-25 MED ORDER — ALPRAZOLAM 0.5 MG PO TABS: 0.5000 mg | ORAL_TABLET | Freq: Four times a day (QID) | ORAL | Status: DC | PRN

## 2012-08-25 MED ORDER — HALOPERIDOL DECANOATE 100 MG/ML IM SOLN: 50.0000 mg | INTRAMUSCULAR | Status: DC

## 2012-08-25 MED ORDER — SUCCINYLCHOLINE CHLORIDE 20 MG/ML IJ SOLN
INTRAMUSCULAR | Status: DC | PRN
  Administered 2012-08-25: 170 mg via INTRAVENOUS

## 2012-08-25 MED ORDER — FLUOXETINE HCL 20 MG PO TABS
40.0000 mg | ORAL_TABLET | Freq: Every day | ORAL | Status: DC
  Administered 2012-08-25 – 2012-08-26 (×2): 40 mg via ORAL
  Filled 2012-08-25 (×2): qty 2

## 2012-08-25 MED ORDER — LACTATED RINGERS IV SOLN
INTRAVENOUS | Status: DC | PRN
  Administered 2012-08-25: via INTRAVENOUS

## 2012-08-25 MED ORDER — PROPOFOL 10 MG/ML IV BOLUS
INTRAVENOUS | Status: DC | PRN
  Administered 2012-08-25: 50 mg via INTRAVENOUS
  Administered 2012-08-25: 200 mg via INTRAVENOUS

## 2012-08-25 MED ORDER — FENTANYL CITRATE 0.05 MG/ML IJ SOLN
INTRAMUSCULAR | Status: DC | PRN
  Administered 2012-08-25: 100 ug via INTRAVENOUS
  Administered 2012-08-25: 150 ug via INTRAVENOUS

## 2012-08-25 MED ORDER — TRAZODONE HCL 100 MG PO TABS
100.0000 mg | ORAL_TABLET | Freq: Every day | ORAL | Status: DC
  Administered 2012-08-25: 100 mg via ORAL
  Filled 2012-08-25 (×2): qty 1

## 2012-08-25 MED ORDER — ONDANSETRON HCL 4 MG/2ML IJ SOLN
INTRAMUSCULAR | Status: DC | PRN
  Administered 2012-08-25: 4 mg via INTRAVENOUS

## 2012-08-25 MED ORDER — VITAMIN C 500 MG PO TABS
1000.0000 mg | ORAL_TABLET | Freq: Every day | ORAL | Status: DC
  Administered 2012-08-25 – 2012-08-26 (×2): 1000 mg via ORAL
  Filled 2012-08-25 (×2): qty 2

## 2012-08-25 MED ORDER — FAMOTIDINE 20 MG PO TABS
20.0000 mg | ORAL_TABLET | Freq: Two times a day (BID) | ORAL | Status: DC | PRN
  Filled 2012-08-25: qty 1

## 2012-08-25 MED ORDER — MIDAZOLAM HCL 5 MG/5ML IJ SOLN
INTRAMUSCULAR | Status: DC | PRN
  Administered 2012-08-25: 2 mg via INTRAVENOUS

## 2012-08-25 MED ORDER — METHOCARBAMOL 100 MG/ML IJ SOLN
500.0000 mg | Freq: Four times a day (QID) | INTRAVENOUS | Status: DC | PRN
  Filled 2012-08-25: qty 5

## 2012-08-25 MED ORDER — DOCUSATE SODIUM 100 MG PO CAPS
100.0000 mg | ORAL_CAPSULE | Freq: Two times a day (BID) | ORAL | Status: DC
  Administered 2012-08-25 – 2012-08-26 (×3): 100 mg via ORAL
  Filled 2012-08-25 (×4): qty 1

## 2012-08-25 MED ORDER — PIPERACILLIN-TAZOBACTAM 3.375 G IVPB
3.3750 g | Freq: Three times a day (TID) | INTRAVENOUS | Status: DC
  Administered 2012-08-25 – 2012-08-26 (×4): 3.375 g via INTRAVENOUS
  Filled 2012-08-25 (×5): qty 50

## 2012-08-25 MED ORDER — OXYCODONE HCL 5 MG/5ML PO SOLN: 5.0000 mg | Freq: Once | ORAL | Status: DC | PRN

## 2012-08-25 MED ORDER — LACTATED RINGERS IV SOLN
INTRAVENOUS | Status: DC
  Administered 2012-08-25: 04:00:00 via INTRAVENOUS

## 2012-08-25 MED ORDER — METOCLOPRAMIDE HCL 5 MG/ML IJ SOLN: 10.0000 mg | Freq: Once | INTRAMUSCULAR | Status: DC | PRN

## 2012-08-25 MED ORDER — LIDOCAINE HCL (CARDIAC) 20 MG/ML IV SOLN
INTRAVENOUS | Status: DC | PRN
  Administered 2012-08-25: 100 mg via INTRAVENOUS

## 2012-08-25 MED ORDER — MORPHINE SULFATE 2 MG/ML IJ SOLN
1.0000 mg | INTRAMUSCULAR | Status: DC | PRN
  Administered 2012-08-25: 1 mg via INTRAVENOUS
  Filled 2012-08-25: qty 1

## 2012-08-25 MED ORDER — BENZTROPINE MESYLATE 1 MG PO TABS
1.0000 mg | ORAL_TABLET | Freq: Every day | ORAL | Status: DC
  Administered 2012-08-25 – 2012-08-26 (×2): 1 mg via ORAL
  Filled 2012-08-25 (×2): qty 1

## 2012-08-25 MED ORDER — VANCOMYCIN HCL 1000 MG IV SOLR
1250.0000 mg | Freq: Two times a day (BID) | INTRAVENOUS | Status: DC
  Administered 2012-08-25 (×2): 1250 mg via INTRAVENOUS
  Filled 2012-08-25 (×3): qty 1250

## 2012-08-25 MED ORDER — MORPHINE SULFATE 2 MG/ML IJ SOLN
INTRAMUSCULAR | Status: AC
  Filled 2012-08-25: qty 1

## 2012-08-25 NOTE — Anesthesia Postprocedure Evaluation (Signed)
Anesthesia Post Note  Patient: Bryan Cummings  Procedure(s) Performed: Procedure(s) (LRB): IRRIGATION AND DEBRIDEMENT EXTREMITY (Left)  Anesthesia type: General  Patient location: PACU  Post pain: Pain level controlled  Post assessment: Patient's Cardiovascular Status Stable  Last Vitals:  Filed Vitals:   08/25/12 0600  BP: 156/100  Pulse: 111  Temp: 36.8 C  Resp:     Post vital signs: Reviewed and stable  Level of consciousness: alert  Complications: No apparent anesthesia complications

## 2012-08-25 NOTE — Progress Notes (Signed)
Subjective: 1 Day Post-Op Procedure(s) (LRB): IRRIGATION AND DEBRIDEMENT EXTREMITY (Left) Patient reports pain as mild.   Patient feels much better in terms of his ability to move the extremity.  He had whirlpool today and  has been taught wet-to-dry dressing changes. Overall he notes improvement. Objective: Vital signs in last 24 hours: Temp:  [97 F (36.1 C)-98.3 F (36.8 C)] 98.3 F (36.8 C) (11/23 0600) Pulse Rate:  [82-111] 111  (11/23 0600) Resp:  [16-20] 18  (11/23 0225) BP: (100-156)/(64-106) 156/100 mmHg (11/23 0600) SpO2:  [93 %-100 %] 97 % (11/23 0600) Weight:  [136.079 kg (300 lb)] 136.079 kg (300 lb) (11/23 0300)  Intake/Output from previous day: 11/22 0701 - 11/23 0700 In: 900 [I.V.:900] Out: -  Intake/Output this shift:     Basename 08/25/12 0651 08/24/12 1810  HGB 15.6 15.7    Basename 08/25/12 0651 08/24/12 1810  WBC 15.0* 11.1*  RBC 5.15 5.03  HCT 46.2 44.2  PLT 218 222    Basename 08/24/12 1810  NA 135  K 4.9  CL 98  CO2 28  BUN 9  CREATININE 0.69  GLUCOSE 82  CALCIUM 9.5   No results found for this basename: LABPT:2,INR:2 in the last 72 hours Physical exam:  He is alert and oriented in no acute distress no neck back chest or abdominal pain patient has improved one conditions and improving ability to move the finger. Neurologically intact ABD soft Neurovascular intact Intact pulses distally Compartment soft  Assessment/Plan: 1 Day Post-Op Procedure(s) (LRB): IRRIGATION AND DEBRIDEMENT EXTREMITY (Left) Will plan to continue IV antibiotics and other postop measures. I discussed with the patient the plans. We'll continue daily whirlpool IV antibiotics await culture results and move forward based upon his wound conditions and status.  Overall he looks better we simply need to stay the course in terms of battling his infection.  Karen Chafe 08/25/2012, 2:58 PM

## 2012-08-25 NOTE — Op Note (Signed)
[name], Bryan Cummings                ACCOUNT NO.:  [id number]  MEDICAL RECORD NO.:  [id number]  LOCATION:  5N12C                        FACILITY:  [facility]  PHYSICIAN:  Dionne Ano. Yandel Zeiner, M.D.DATE OF BIRTH:  [birth year]  DATE OF PROCEDURE: DATE OF DISCHARGE:                              OPERATIVE REPORT   PREOPERATIVE DIAGNOSIS:  Abscess, left middle finger.  POSTOPERATIVE DIAGNOSIS:  Abscess, left middle finger.  PROCEDURE: 1. I and D, left middle finger abscess deep in location. 2. Flexor tenolysis tenosynovectomy and flexor sheath I and D,     extensive in nature, left middle finger.  SURGEON:  Dionne Ano. Amanda Pea, M.D.  ASSISTANT:  None.  COMPLICATIONS:  None.  ANESTHESIA:  General.  TOURNIQUET TIME:  Less than 13 minutes.  INDICATIONS FOR THE PROCEDURE:  A 31 year old male with progressive abnormalities in his hand.  He presents for the above-mentioned operative intervention.  He has had greater than a week's history of pain, swelling, and marked with presentation to the ER.  I have discussed with him the risks and benefits, and he desires to proceed with the above-mentioned operative intervention.  OPERATIVE PROCEDURE:  The patient was seen by myself and Anesthesia, taken to the operative suite, underwent smooth induction of general anesthesia.  Prepped and draped in usual sterile fashion about the left upper extremity.  Following this, a curvilinear incision with modified Lorrene Reid nature was made over callused area over the PIP joint. Dissection was carried down, immediate purulence was encountered.  This was cultured for aerobic and anaerobic cultures.  Vancomycin was then started IV.  Following this, I then identified the flexor sheath through a separate incision over the MP region, opened the sheath, then performed tenolysis tenosynovectomy.  There was not a horrible looks to the tendons that were competent.  There were some physiologic fluid in them.  This time, we  placed 3 liters of fluid through the tendon sheath regions and the abscess regions.  Following this, we then placed vessel loop drains and dressed the patient's sterilely with tourniquet deflated and refill noted to be excellent.  The deep abscess was accomplished.  Also flexor tenolysis tenosynovectomy and I and D was accomplished as well.  The patient tolerated this well.  There were no complicating features.  He will be admitted for IV antibiotics, general postop observation, and notify us should any problems occur.  We will monitor his condition closely in hopes that we can eradicate his infection swiftly.     Dionne Ano. Amanda Pea, M.D.     [facility]  D:  08/25/2012  T:  08/25/2012  Job:  [id number]

## 2012-08-25 NOTE — Transfer of Care (Signed)
Immediate Anesthesia Transfer of Care Note  Patient: Bryan Cummings  Procedure(s) Performed: Procedure(s) (LRB) with comments: IRRIGATION AND DEBRIDEMENT EXTREMITY (Left) - Irrigation and debridement left third finger  Patient Location: PACU  Anesthesia Type:General  Level of Consciousness: awake, alert  and oriented  Airway & Oxygen Therapy: Patient Spontanous Breathing and Patient connected to nasal cannula oxygen  Post-op Assessment: Report given to PACU RN, Post -op Vital signs reviewed and stable and Patient moving all extremities X 4  Post vital signs: Reviewed and stable  Complications: No apparent anesthesia complications

## 2012-08-25 NOTE — Progress Notes (Signed)
ANTIBIOTIC CONSULT NOTE - INITIAL  Pharmacy Consult for Vancocin/Zosyn Indication: finger abscess  No Known Allergies  Patient Measurements: Height: 6' (182.9 cm) Weight: 300 lb (136.079 kg) IBW/kg (Calculated) : 77.6   Vital Signs: Temp: 97.8 F (36.6 C) (11/23 0225) Temp src: Oral (11/22 2113) BP: 156/106 mmHg (11/23 0225) Pulse Rate: 107  (11/23 0225)  Labs:  [facility] 08/24/12 1810  WBC 11.1*  HGB 15.7  PLT 222  LABCREA --  CREATININE 0.69   Estimated Creatinine Clearance: 194.6 ml/min (by C-G formula based on Cr of 0.69).  Microbiology: No results found for this or any previous visit (from the past 720 hour(s)).  Medical History: Past Medical History  Diagnosis Date  . Asthma   . Tourette's disease   . OCD (obsessive compulsive disorder)     Medications:  Prescriptions prior to admission  Medication Sig Dispense Refill  . benztropine (COGENTIN) 1 MG tablet Take 1 mg by mouth daily.      . divalproex (DEPAKOTE) 250 MG DR tablet Take 1,250 mg by mouth at bedtime.      Marland Kitchen FLUoxetine (PROZAC) 20 MG tablet Take 40 mg by mouth daily.      . haloperidol decanoate (HALDOL DECANOATE) 100 MG/ML injection Inject 50 mg into the muscle every 28 (twenty-eight) days.      . traZODone (DESYREL) 100 MG tablet Take 100 mg by mouth at bedtime.      . [DISCONTINUED] acetaminophen (TYLENOL) 500 MG tablet Take 1,000 mg by mouth every 6 (six) hours as needed. For pain      . silver sulfADIAZINE (SILVADENE) 1 % cream Apply topically daily.  50 g  0   Scheduled:    . benztropine  1 mg Oral Daily  . divalproex  1,250 mg Oral QHS  . docusate sodium  100 mg Oral BID  . FLUoxetine  40 mg Oral Daily  . haloperidol decanoate  50 mg Intramuscular Q28 days  . morphine      . piperacillin-tazobactam  3.375 g Intravenous Once  . piperacillin-tazobactam (ZOSYN)  IV  3.375 g Intravenous Q8H  . traZODone  100 mg Oral QHS  . vancomycin  1,250 mg Intravenous Q12H  . vancomycin  1,500 mg  Intravenous NOW  . vitamin C  1,000 mg Oral Daily    Assessment: 31yo male c/o swelling of left middle finger, found with abscess, now s/p I&D, to begin IV ABX.  Goal of Therapy:  Vancomycin trough level 10-15 mcg/ml  Plan:  Rec'd vanc 1g in OR; will give additional vanc 1500mg  IV x1 for total load of 2500mg  then begin 1250mg  IV Q12H; will also begin Zosyn 3.375g IV Q8H and monitor CBC, Cx, levels prn.  Colleen Can PharmD BCPS 08/25/2012,4:32 AM

## 2012-08-25 NOTE — Op Note (Signed)
See Dictation # [id number] Dominica Severin MD

## 2012-08-25 NOTE — Anesthesia Procedure Notes (Signed)
Procedure Name: Intubation Date/Time: 08/25/2012 12:40 AM Performed by: Molli Hazard Pre-anesthesia Checklist: Patient identified, Emergency Drugs available, Suction available and Patient being monitored Patient Re-evaluated:Patient Re-evaluated prior to inductionOxygen Delivery Method: Circle system utilized Preoxygenation: Pre-oxygenation with 100% oxygen Intubation Type: IV induction, Rapid sequence and Cricoid Pressure applied Laryngoscope Size: Miller and 2 Grade View: Grade I Tube type: Oral Tube size: 7.5 mm Number of attempts: 1 Airway Equipment and Method: Stylet Placement Confirmation: ETT inserted through vocal cords under direct vision,  positive ETCO2 and breath sounds checked- equal and bilateral Secured at: 23 cm Tube secured with: Tape Dental Injury: Teeth and Oropharynx as per pre-operative assessment

## 2012-08-25 NOTE — ED Notes (Signed)
Report to [place], rn and not [place]

## 2012-08-25 NOTE — Progress Notes (Signed)
PT HYDROTHERAPY EVALUATION NOTE    08/25/12 1500  Subjective Assessment  Subjective I haven't been able to see it yet.  Patient and Family Stated Goals To be able to use my finger again  Date of Onset (prior to admission)  Prior Treatments I & D  Evaluation and Treatment  Evaluation and Treatment Procedures Explained to Patient/Family Yes  Evaluation and Treatment Procedures agreed to  Wound 08/25/12 Other (Comment)  Left  Date First Assessed/Time First Assessed: 08/25/12 1203   Wound Type: Other (Comment)  Location: (c)   Location Orientation: Left  Present on Admission: Yes  Site / Wound Assessment Bleeding;Red;Pink;Granulation tissue  % Wound base Red or Granulating 85%  % Wound base Yellow 10%  % Wound base Other (Comment) 5% (clotted blood)  Peri-wound Assessment Edema;Erythema (blanchable);Pink  Wound Length (cm) 1.5 cm  Wound Width (cm) 1 cm  Wound Depth (cm) 0.1 cm  Tunneling (cm) 1.0 (through finger where drain was placed)  Margins Attached edges (approximated)  Drainage Amount Minimal  Drainage Description Sanguineous;Serosanguineous  Treatment Hydrotherapy (Pulse lavage);Debridement (Selective);Packing (Saline gauze) (2 " guaze wrap; 4x4 dry gauze over wet to dry fluffed guaze)  Dressing Type Gauze (Comment) (2 " guaze wrap; 4x4 dry gauze over wet to dry fluffed guaze)  Dressing Changed New  Dressing Status Clean;Intact  Hydrotherapy  Pulsed Lavage with Suction (psi) 4 psi  Pulsed Lavage with Suction - Normal Saline Used 1000 mL  Pulsed Lavage Tip Tip with splash shield  Pulsed lavage therapy - wound location left index finger  Selective Debridement  Selective Debridement - Location left index finger  Selective Debridement - Tools Used Forceps;Scissors  Selective Debridement - Tissue Removed dried clot  Wound Therapy - Assess/Plan/Recommendations  Wound Therapy - Clinical Statement Pt is a 31 y.o. male who presents with surgical wound s/p I&D following crush  injury to left index finger.  Pt is pleasent and cooperative. Removed drain x2; provided PLS and redressed wound.  Pt and aunt educated in technique for dressing change as well as educated for elevation and retrograde massage for edema control.  Pt very receptive. Pt tolerated therapy well. Feel pt will be able to perform dressing changes at home without difficulty.  Wound Therapy - Functional Problem List decreased skin integrity and infection causing edema which limits ROM and functional use of left index finger.  Factors Delaying/Impairing Wound Healing Altered sensation;Infection - systemic/local;Immobility  Hydrotherapy Plan Pulsatile lavage with suction  Wound Therapy - Frequency 6X / week  Wound Plan Provide dressing supplies; perform hydrotherapy PRN.  Wound Therapy Goals - Improve the function of patient's integumentary system by progressing the wound(s) through the phases of wound healing by:  Improve Drainage Characteristics (scant)  Improve Drainage Characteristics - Progress Goal set today  Additional Wound Therapy Goal Pt to be able to perform Independent dressing changes  Additional Wound Therapy Goal - Progress Goal set today  Goals/treatment plan/discharge plan were made with and agreed upon by patient/family Yes  Time For Goal Achievement 3 days  Wound Therapy - Potential for Goals Excellent     Charlotte Crumb, PT DPT  [phone]

## 2012-08-26 MED ORDER — DOXYCYCLINE HYCLATE 100 MG PO TABS: 100.0000 mg | ORAL_TABLET | Freq: Two times a day (BID) | ORAL | Status: DC

## 2012-08-26 NOTE — Progress Notes (Signed)
Pt discharged home. Discharge instructions given to grandparent and patient. Prescription for antibiotic was given. All questions were answered.

## 2012-08-26 NOTE — Progress Notes (Signed)
Patient ID: Bryan Cummings, male   DOB: [birth year], 31 y.o.   MRN: [id number] Patient is seen at bedside.  Procedure note patient underwent irrigation and debridement skin subcutaneous tissue and flexor tendon sheath tissue. This was performed with scissors curet and knife blade the patient was irrigated copiously and the wound was packed aggressively at bedside with a wet-to-dry dressing. I instructed the patient on fall to my office. I feel be sent for discharge tomorrow. He was cleansed and instructed in the post surgical care plan. He is doing much better his pain is minimal and his 1 conditions are markedly improved.  He is doing quite well overall.  Will plan for discharge today is to [place] note reflects.

## 2012-08-26 NOTE — Discharge Summary (Signed)
Physician Discharge Summary  Patient ID: Bryan Cummings MRN: [id number] DOB/AGE: [birth year] 29 y.o.  Admit date: 08/24/2012 Discharge date: 08/26/2012  Admission Diagnoses: Infection left middle finger/abscess  Discharge Diagnoses: Same status post surgical I&D Active Problems:  * No active hospital problems. *    Discharged Condition: good  Hospital Course: Patient was admitted for IV antibiotics and aggressive irrigation and debridement. He went to the surgical arena and underwent an irrigation and debridement. Postop day 1 he was whirlpool tomorrow. Postop day 2 I performed I&D of skin is a chance tissue his notes will reflect. He looks much better he is ready for discharge we'll follow his cultures in my office in [place] him on appropriate antibiotics for presumptive staph infection. I will adjust his antibiotics accordingly based upon the final cultures I discussed with patient and his family all issues and they decided proceed with discharge home. At time of discharge had a normal exam including ability to void no pain no complications and felt quite well  Consults: None  Significant Diagnostic Studies: labs: Including CBC etc. as a notes detail  Treatments: surgery: Patient underwent irrigation and debridement procedures as discussed in his chart. I performed I&D today 08/26/2012 and he looks much better.  Discharge Exam: Blood pressure 133/65, pulse 92, temperature 98.2 F (36.8 C), temperature source Oral, resp. rate 19, height 6' (1.829 m), weight 136.079 kg (300 lb), SpO2 94.00%. Incision/Wound: improve one conditions were noted with no Kan[place] signs .Marland KitchenThe patient is alert and oriented in no acute distress the patient complains of pain in the affected upper extremity. The patient is noted to have a normal HEENT exam. Lung fields show equal chest expansion and no shortness of breath abdomen exam is nontender without distention. Lower extremity examination does not show any fracture  dislocation or blood clot symptoms. Pelvis is stable neck and back are stable and nontender  Disposition: 04-Intermediate Care Facility     Medication List     As of 08/26/2012 11:27 AM    ASK your doctor about these medications         acetaminophen 500 MG tablet   Commonly known as: TYLENOL   Take 1,000 mg by mouth every 6 (six) hours as needed. For pain      benztropine 1 MG tablet   Commonly known as: COGENTIN   Take 1 mg by mouth daily.      divalproex 250 MG DR tablet   Commonly known as: DEPAKOTE   Take 1,250 mg by mouth at bedtime.      FLUoxetine 20 MG tablet   Commonly known as: PROZAC   Take 40 mg by mouth daily.      haloperidol decanoate 100 MG/ML injection   Commonly known as: HALDOL DECANOATE   Inject 50 mg into the muscle every 28 (twenty-eight) days.      silver sulfADIAZINE 1 % cream   Commonly known as: SILVADENE   Apply topically daily.      traZODone 100 MG tablet   Commonly known as: DESYREL   Take 100 mg by mouth at bedtime.           Follow-up Information    Follow up with Karen Chafe, MD. (Please come to my office Monday at 11 AM for repeat evaluation)    Contact information:   [address] [place] [address] [phone]          Signed: Karen Chafe 08/26/2012, 11:27  AM

## 2012-08-27 ENCOUNTER — Encounter (HOSPITAL_COMMUNITY): Payer: Self-pay | Admitting: Orthopedic Surgery

## 2012-08-28 ENCOUNTER — Encounter (HOSPITAL_COMMUNITY): Payer: Self-pay

## 2012-08-28 LAB — CULTURE, ROUTINE-ABSCESS

## 2012-08-30 LAB — ANAEROBIC CULTURE: Gram Stain: NONE SEEN

## 2016-05-04 ENCOUNTER — Ambulatory Visit (INDEPENDENT_AMBULATORY_CARE_PROVIDER_SITE_OTHER): Payer: Medicare Other | Admitting: Podiatry

## 2016-05-04 ENCOUNTER — Encounter: Payer: Self-pay | Admitting: Podiatry

## 2016-05-04 VITALS — BP 143/90 | HR 109 | Resp 16 | Ht 72.0 in | Wt 360.0 lb

## 2016-05-04 DIAGNOSIS — M79675 Pain in left toe(s): Secondary | ICD-10-CM

## 2016-05-04 DIAGNOSIS — B351 Tinea unguium: Secondary | ICD-10-CM | POA: Diagnosis not present

## 2016-05-04 DIAGNOSIS — Q828 Other specified congenital malformations of skin: Secondary | ICD-10-CM | POA: Diagnosis not present

## 2016-05-04 DIAGNOSIS — M79674 Pain in right toe(s): Secondary | ICD-10-CM | POA: Diagnosis not present

## 2016-05-04 NOTE — Progress Notes (Signed)
   Subjective:    Patient ID: Bryan Cummings, male    DOB: 12-16-1980, 35 y.o.   MRN: [id number]  HPI Chief Complaint  Patient presents with  . Debridement    Bilateral nail trim  . Callouses    Bilateral; great toes-medial  . Nail Problem    Bilateral; all nails; needs to be checked for nail fungus      Review of Systems  All other systems reviewed and are negative.      Objective:   Physical Exam        Assessment & Plan:

## 2016-05-04 NOTE — Progress Notes (Signed)
Subjective:     Patient ID: Bryan Cummings, male   DOB: 1980/12/29, 35 y.o.   MRN: [id number]  HPI patient presents with severe nail disease 1-5 both feet that are painful and lesions on both feet that are very painful. Patient cannot take care of them himself may get very sore and making it difficult for him to   Review of Systems  All other systems reviewed and are negative.      Objective:   Physical Exam  Constitutional: He is oriented to person, place, and time.  Cardiovascular: Intact distal pulses.   Musculoskeletal: Normal range of motion.  Neurological: He is oriented to person, place, and time.  Skin: Skin is warm and dry.  Nursing note and vitals reviewed.  neurovascular status was found to be intact with muscle and normal limits and patient's noted to have severe nail disease 1-5 both feet with elongation of the nailbeds thickness and incurvation of the corners. Patient's also noted to have lesion formation bilateral that's painful and makes wearing shoe gear difficult. Patient has good digital perfusion and is well oriented 3     Assessment:     Severe nail disease 1-5 both feet with thick incurvated corners and lesion formation that's very painful with inability to take care of nails or lesions    Plan:     H&P conditions reviewed and today debridement of nailbeds accomplished and debridement of lesions with no iatrogenic bleeding noted. Patient will be seen back for routine care as needed was advised on generalized foot care

## 2017-12-15 ENCOUNTER — Encounter: Payer: Self-pay | Admitting: Podiatry

## 2017-12-15 ENCOUNTER — Ambulatory Visit (INDEPENDENT_AMBULATORY_CARE_PROVIDER_SITE_OTHER): Payer: Medicare Other | Admitting: Podiatry

## 2017-12-15 DIAGNOSIS — M79609 Pain in unspecified limb: Secondary | ICD-10-CM | POA: Diagnosis not present

## 2017-12-15 DIAGNOSIS — B351 Tinea unguium: Secondary | ICD-10-CM | POA: Diagnosis not present

## 2017-12-15 DIAGNOSIS — L84 Corns and callosities: Secondary | ICD-10-CM | POA: Diagnosis not present

## 2017-12-15 NOTE — Progress Notes (Signed)
Subjective:   Patient ID: Bryan Cummings, male   DOB: 37 y.o.   MRN: [id number]   HPI Patient presents with severe nail disease 1-5 both feet with severe elongation and severe keratotic lesion hallux bilateral that are very painful   ROS      Objective:  Physical Exam  Neurovascular status intact with severe nail disease with pain 1-5 both feet and lesion formation hallux bilateral that are painful     Assessment:  Severe mycotic nail infection bilateral with lesion formation bilateral     Plan:  Debridement of elongated nailbeds 1-5 both feet and lesion formation hallux bilateral with no iatrogenic bleeding noted

## 2018-09-01 ENCOUNTER — Emergency Department (HOSPITAL_COMMUNITY): Payer: Medicare Other

## 2018-09-01 ENCOUNTER — Other Ambulatory Visit: Payer: Self-pay

## 2018-09-01 ENCOUNTER — Encounter (HOSPITAL_COMMUNITY): Payer: Self-pay | Admitting: Radiology

## 2018-09-01 ENCOUNTER — Inpatient Hospital Stay (HOSPITAL_COMMUNITY)
Admission: EM | Admit: 2018-09-01 | Discharge: 2018-09-02 | DRG: 872 | Disposition: A | Discharge status: Home / Self Care | Payer: Medicare Other | Attending: Internal Medicine | Admitting: Internal Medicine

## 2018-09-01 DIAGNOSIS — Z79899 Other long term (current) drug therapy: Secondary | ICD-10-CM | POA: Diagnosis not present

## 2018-09-01 DIAGNOSIS — Z720 Tobacco use: Secondary | ICD-10-CM

## 2018-09-01 DIAGNOSIS — Z6841 Body Mass Index (BMI) 40.0 and over, adult: Secondary | ICD-10-CM

## 2018-09-01 DIAGNOSIS — F952 Tourette's disorder: Secondary | ICD-10-CM | POA: Diagnosis present

## 2018-09-01 DIAGNOSIS — A419 Sepsis, unspecified organism: Principal | ICD-10-CM

## 2018-09-01 DIAGNOSIS — F17210 Nicotine dependence, cigarettes, uncomplicated: Secondary | ICD-10-CM | POA: Diagnosis present

## 2018-09-01 DIAGNOSIS — F429 Obsessive-compulsive disorder, unspecified: Secondary | ICD-10-CM

## 2018-09-01 DIAGNOSIS — Z716 Tobacco abuse counseling: Secondary | ICD-10-CM | POA: Diagnosis not present

## 2018-09-01 DIAGNOSIS — N611 Abscess of the breast and nipple: Secondary | ICD-10-CM | POA: Diagnosis present

## 2018-09-01 DIAGNOSIS — N61 Mastitis without abscess: Secondary | ICD-10-CM

## 2018-09-01 DIAGNOSIS — J45909 Unspecified asthma, uncomplicated: Secondary | ICD-10-CM | POA: Diagnosis present

## 2018-09-01 DIAGNOSIS — Z818 Family history of other mental and behavioral disorders: Secondary | ICD-10-CM | POA: Diagnosis not present

## 2018-09-01 DIAGNOSIS — J4520 Mild intermittent asthma, uncomplicated: Secondary | ICD-10-CM | POA: Diagnosis not present

## 2018-09-01 DIAGNOSIS — E669 Obesity, unspecified: Secondary | ICD-10-CM | POA: Diagnosis present

## 2018-09-01 DIAGNOSIS — I10 Essential (primary) hypertension: Secondary | ICD-10-CM | POA: Diagnosis present

## 2018-09-01 HISTORY — DX: Tobacco use: Z72.0

## 2018-09-01 LAB — COMPREHENSIVE METABOLIC PANEL
ALT: 18 U/L (ref 0–44)
AST: 16 U/L (ref 15–41)
Albumin: 3.9 g/dL (ref 3.5–5.0)
Alkaline Phosphatase: 54 U/L (ref 38–126)
Anion gap: 12 (ref 5–15)
BUN: 13 mg/dL (ref 6–20)
CALCIUM: 9.6 mg/dL (ref 8.9–10.3)
CO2: 26 mmol/L (ref 22–32)
Chloride: 97 mmol/L — ABNORMAL LOW (ref 98–111)
Creatinine, Ser: 0.99 mg/dL (ref 0.61–1.24)
GFR calc Af Amer: >60 mL/min (ref >60)
GFR calc non Af Amer: >60 mL/min (ref >60)
Glucose, Bld: 86 mg/dL (ref 70–99)
Potassium: 3.8 mmol/L (ref 3.5–5.1)
Sodium: 135 mmol/L (ref 135–145)
Total Bilirubin: 0.3 mg/dL (ref 0.3–1.2)
Total Protein: 7.6 g/dL (ref 6.5–8.1)

## 2018-09-01 LAB — CBC WITH DIFFERENTIAL/PLATELET
Abs Immature Granulocytes: 0.25 10*3/uL — ABNORMAL HIGH (ref 0.00–0.07)
Basophils Absolute: 0 10*3/uL (ref 0.0–0.1)
Basophils Relative: 0 %
Eosinophils Absolute: 0.3 10*3/uL (ref 0.0–0.5)
Eosinophils Relative: 2 %
HCT: 40.2 % (ref 39.0–52.0)
Hemoglobin: 12.8 g/dL — ABNORMAL LOW (ref 13.0–17.0)
Immature Granulocytes: 2 %
Lymphocytes Relative: 19 %
Lymphs Abs: 2.8 10*3/uL (ref 0.7–4.0)
MCH: 28.8 pg (ref 26.0–34.0)
MCHC: 31.8 g/dL (ref 30.0–36.0)
MCV: 90.5 fL (ref 80.0–100.0)
MONO ABS: 2 10*3/uL — AB (ref 0.1–1.0)
Monocytes Relative: 14 %
Neutro Abs: 9.2 10*3/uL — ABNORMAL HIGH (ref 1.7–7.7)
Neutrophils Relative %: 63 %
Platelets: 385 10*3/uL (ref 150–400)
RBC: 4.44 MIL/uL (ref 4.22–5.81)
RDW: 15.7 % — ABNORMAL HIGH (ref 11.5–15.5)
WBC: 14.5 10*3/uL — ABNORMAL HIGH (ref 4.0–10.5)
nRBC: 0 % (ref 0.0–0.2)

## 2018-09-01 LAB — PROCALCITONIN: Procalcitonin: <0.1 ng/mL

## 2018-09-01 LAB — PROTIME-INR
INR: 0.93
Prothrombin Time: 12.4 seconds (ref 11.4–15.2)

## 2018-09-01 LAB — APTT: aPTT: 32 seconds (ref 24–36)

## 2018-09-01 LAB — I-STAT CG4 LACTIC ACID, ED: Lactic Acid, Venous: 1.65 mmol/L (ref 0.5–1.9)

## 2018-09-01 LAB — LACTIC ACID, PLASMA: LACTIC ACID, VENOUS: 0.7 mmol/L (ref 0.5–1.9)

## 2018-09-01 MED ORDER — HYDROMORPHONE HCL 1 MG/ML IJ SOLN
1.0000 mg | Freq: Once | INTRAMUSCULAR | Status: AC
  Administered 2018-09-01: 1 mg via INTRAVENOUS
  Filled 2018-09-01: qty 1

## 2018-09-01 MED ORDER — LORATADINE 10 MG PO TABS
10.0000 mg | ORAL_TABLET | Freq: Every day | ORAL | Status: DC
  Administered 2018-09-01: 10 mg via ORAL
  Filled 2018-09-01: qty 1

## 2018-09-01 MED ORDER — NICOTINE 21 MG/24HR TD PT24
21.0000 mg | MEDICATED_PATCH | Freq: Every day | TRANSDERMAL | Status: DC
  Administered 2018-09-02: 21 mg via TRANSDERMAL
  Filled 2018-09-01 (×2): qty 1

## 2018-09-01 MED ORDER — SODIUM CHLORIDE 0.9 % IV SOLN
2.0000 g | Freq: Once | INTRAVENOUS | Status: AC
  Administered 2018-09-01: 2 g via INTRAVENOUS
  Filled 2018-09-01: qty 20

## 2018-09-01 MED ORDER — IOHEXOL 300 MG/ML  SOLN
100.0000 mL | Freq: Once | INTRAMUSCULAR | Status: AC | PRN
  Administered 2018-09-01: 100 mL via INTRAVENOUS

## 2018-09-01 MED ORDER — BENZTROPINE MESYLATE 1 MG PO TABS
1.0000 mg | ORAL_TABLET | Freq: Every day | ORAL | Status: DC
  Administered 2018-09-01: 1 mg via ORAL
  Filled 2018-09-01 (×2): qty 1

## 2018-09-01 MED ORDER — ZOLPIDEM TARTRATE 5 MG PO TABS: 5.0000 mg | ORAL_TABLET | Freq: Every evening | ORAL | Status: DC | PRN

## 2018-09-01 MED ORDER — VANCOMYCIN HCL 10 G IV SOLR
2500.0000 mg | Freq: Once | INTRAVENOUS | Status: AC
  Administered 2018-09-01: 2500 mg via INTRAVENOUS
  Filled 2018-09-01: qty 2500

## 2018-09-01 MED ORDER — HYDROMORPHONE HCL 1 MG/ML IJ SOLN: 1.0000 mg | INTRAMUSCULAR | Status: DC | PRN

## 2018-09-01 MED ORDER — VITAMIN D 25 MCG (1000 UNIT) PO TABS
2000.0000 [IU] | ORAL_TABLET | Freq: Every day | ORAL | Status: DC
  Administered 2018-09-01 – 2018-09-02 (×2): 2000 [IU] via ORAL
  Filled 2018-09-01 (×2): qty 2

## 2018-09-01 MED ORDER — SODIUM CHLORIDE 0.9 % IV BOLUS
1000.0000 mL | Freq: Once | INTRAVENOUS | Status: AC
  Administered 2018-09-01: 1000 mL via INTRAVENOUS

## 2018-09-01 MED ORDER — ONDANSETRON HCL 4 MG/2ML IJ SOLN: 4.0000 mg | Freq: Three times a day (TID) | INTRAMUSCULAR | Status: DC | PRN

## 2018-09-01 MED ORDER — ACETAMINOPHEN 325 MG PO TABS
650.0000 mg | ORAL_TABLET | Freq: Four times a day (QID) | ORAL | Status: DC | PRN
  Filled 2018-09-01: qty 2

## 2018-09-01 MED ORDER — OXYCODONE-ACETAMINOPHEN 5-325 MG PO TABS: 1.0000 | ORAL_TABLET | ORAL | Status: DC | PRN

## 2018-09-01 MED ORDER — DIVALPROEX SODIUM ER 500 MG PO TB24
1500.0000 mg | ORAL_TABLET | Freq: Every day | ORAL | Status: DC
  Administered 2018-09-01: 1500 mg via ORAL
  Filled 2018-09-01 (×2): qty 3

## 2018-09-01 MED ORDER — POLYETHYLENE GLYCOL 3350 17 G PO PACK: 17.0000 g | PACK | Freq: Every day | ORAL | Status: DC | PRN

## 2018-09-01 MED ORDER — SODIUM CHLORIDE 0.9 % IV BOLUS
1500.0000 mL | Freq: Once | INTRAVENOUS | Status: AC
  Administered 2018-09-01: 1000 mL via INTRAVENOUS

## 2018-09-01 MED ORDER — ONDANSETRON HCL 4 MG/2ML IJ SOLN
4.0000 mg | Freq: Once | INTRAMUSCULAR | Status: AC
  Administered 2018-09-01: 4 mg via INTRAVENOUS
  Filled 2018-09-01: qty 2

## 2018-09-01 MED ORDER — DM-GUAIFENESIN ER 30-600 MG PO TB12: 1.0000 | ORAL_TABLET | Freq: Two times a day (BID) | ORAL | Status: DC | PRN

## 2018-09-01 MED ORDER — FLUOXETINE HCL 20 MG PO CAPS
60.0000 mg | ORAL_CAPSULE | Freq: Every day | ORAL | Status: DC
  Administered 2018-09-01 – 2018-09-02 (×2): 60 mg via ORAL
  Filled 2018-09-01 (×3): qty 3

## 2018-09-01 MED ORDER — SODIUM CHLORIDE 0.9 % IV SOLN
2.0000 g | INTRAVENOUS | Status: DC
  Administered 2018-09-02: 2 g via INTRAVENOUS
  Filled 2018-09-01 (×2): qty 20

## 2018-09-01 MED ORDER — HYDRALAZINE HCL 20 MG/ML IJ SOLN: 5.0000 mg | INTRAMUSCULAR | Status: DC | PRN

## 2018-09-01 MED ORDER — LEVALBUTEROL HCL 1.25 MG/0.5ML IN NEBU: 1.2500 mg | INHALATION_SOLUTION | Freq: Four times a day (QID) | RESPIRATORY_TRACT | Status: DC | PRN

## 2018-09-01 MED ORDER — SODIUM CHLORIDE 0.9 % IV SOLN
INTRAVENOUS | Status: DC
  Administered 2018-09-01 – 2018-09-02 (×2): via INTRAVENOUS

## 2018-09-01 MED ORDER — LEVALBUTEROL HCL 1.25 MG/0.5ML IN NEBU
1.2500 mg | INHALATION_SOLUTION | Freq: Four times a day (QID) | RESPIRATORY_TRACT | Status: DC
  Filled 2018-09-01: qty 0.5

## 2018-09-01 MED ORDER — VANCOMYCIN HCL 10 G IV SOLR
1750.0000 mg | Freq: Two times a day (BID) | INTRAVENOUS | Status: DC
  Administered 2018-09-01 – 2018-09-02 (×2): 1750 mg via INTRAVENOUS
  Filled 2018-09-01 (×4): qty 1750

## 2018-09-01 NOTE — Progress Notes (Signed)
**Note De-Identified vi Obfusction** PROGRESS NOTE    Bryan Cummings  JHE:[id number] DOB: May 29, 1981 DOA: 09/01/2018 PCP: [facility]   Brief Nrrtive:  Bryan Cummings is  37 y.o. mle with medicl history significnt of sthm, Tourette's disese, OCD, tobcco buse, obesity, right brest cellulitis, who presents with left brest pin.  Assessment & Pln:   Principl Problem:   Abscess of mle brest Active Problems:   Asthm   OCD (obsessive compulsive disorder)   Tourette's disese   Cellulitis of left brest   Tobcco buse   Sepsis ([place])   HTN (hypertension)   Sepsis due to bscess nd cellulitis of mle brest: Ptient filed outptient ntibiotic tretment with bctrim.  Ptient meets criteri for sepsis with leukocytosis nd tchycrdi.  Lctic cid is norml.  Currently hemodynmiclly stble.   - Continue vncomycin/ceftrixone for now - Per generl surgery, recommending evlution t [facility] for dringe/biopsy (concerning for mlignncy) - CT with 4.8 x 7 x 5.1 cm thick wlled collection within centrl spect of brest with surrounding inflmmtory chnges.  Concerning for infection with bscess, but needs f/u with mmmogrm/US to help r/o cncer (see report).  Axillry denopthy needs f/u to resolution. - Follow erobic culture (pt begn spontneously drining tody, collected superficil cx from this) - wound cre c/s - Follow blood cultures - ESR nd CRP pending - Negtive PCT - Surgery c/s, pprecite recs  HTN: Blood pressure 116/81 - holding ntihypertensives for now  Asthm: stble -PRN Xopenex nebulizer -As needed Mucinex for cough  OCD nd Tourette's disese: Ptient is clm.  Ptient is getting hldol injection q. 28 dys, lst dose ws on 11/6. -Continue Depkote, Prozc, Cogentin  Tobcco buse: -Did counseling bout importnce of quitting smoking -Nicotine ptch  DVT prophylxis: SCD Code Sttus: full  Fmily Communiction: grndmother t  bedside Disposition Pln: pending  Consultnts:   surgery  Procedures:  none  Antimicrobils:  Anti-infectives (From dmission, onwrd)   Strt     Dose/Rte Route Frequency Ordered Stop   09/02/18 0200  cefTRIAXone (ROCEPHIN) 2 g in sodium chloride 0.9 % 100 mL IVPB     2 g 200 mL/hr over 30 Minutes Intrvenous Every 24 hours 09/01/18 0457     09/01/18 1500  vncomycin (VANCOCIN) 1,750 mg in sodium chloride 0.9 % 500 mL IVPB     1,750 mg 250 mL/hr over 120 Minutes Intrvenous Every 12 hours 09/01/18 1313     09/01/18 0145  cefTRIAXone (ROCEPHIN) 2 g in sodium chloride 0.9 % 100 mL IVPB     2 g 200 mL/hr over 30 Minutes Intrvenous  Once 09/01/18 0136 09/01/18 0237   09/01/18 0145  vncomycin (VANCOCIN) 2,500 mg in sodium chloride 0.9 % 500 mL IVPB     2,500 mg 250 mL/hr over 120 Minutes Intrvenous  Once 09/01/18 0140 09/01/18 0437         Subjective: Sleepy. Notes he hit his chest on something, then noted worsening swelling. Grndmother t bedside.   Objective: Vitls:   09/01/18 0418 09/01/18 0530 09/01/18 0623 09/01/18 0624  BP: 116/81 135/76  135/85  Pulse: 93 89  88  Resp: 18 (!) 21    Temp:    97.9 F (36.6 C)  TempSrc:    Orl  SpO2: 92% 90%  97%  Weight:   (!) 167.4 kg   Height:   6' (1.829 m)     Intke/Output Summry (Lst 24 hours) t 09/01/2018 0858 Lst dt filed t 09/01/2018 0644 Gross per 24 hour Intake 2100 ml  Output -  Net 2100 ml   Filed Weights   09/01/18 0103 09/01/18 0623  Weight: (!) 166.9 kg (!) 167.4 kg    Examination:  General exam: Appears calm and comfortable  Respiratory system: Clear to auscultation. Respiratory effort normal. Cardiovascular system: S1 & S2 heard, RRR. Gastrointestinal system: Abdomen is nondistended, soft and nontender Central nervous system: Alert and oriented. No focal neurological deficits. Extremities: Symmetric 5 x 5 power. Skin: erythematous and peeling skin to L breast.  Nipple retracted.    Psychiatry: Judgement and insight appear normal. Mood & affect appropriate.     Data Reviewed: I have personally reviewed following labs and imaging studies  CBC: Recent Labs  Lab 09/01/18 0138  WBC 14.5*  NEUTROABS 9.2*  HGB 12.8*  HCT 40.2  MCV 90.5  PLT 211   Basic Metabolic Panel: Recent Labs  Lab 09/01/18 0138  NA 135  K 3.8  CL 97*  CO2 26  GLUCOSE 86  BUN 13  CREATININE 0.99  CALCIUM 9.6   GFR: Estimated Creatinine Clearance: 167.2 mL/min (by C-G formula based on SCr of 0.99 mg/dL). Liver Function Tests: Recent Labs  Lab 09/01/18 0138  AST 16  ALT 18  ALKPHOS 54  BILITOT 0.3  PROT 7.6  ALBUMIN 3.9   No results for input(s): LIPASE, AMYLASE in the last 168 hours. No results for input(s): AMMONIA in the last 168 hours. Coagulation Profile: Recent Labs  Lab 09/01/18 0508  INR 0.93   Cardiac Enzymes: No results for input(s): CKTOTAL, CKMB, CKMBINDEX, TROPONINI in the last 168 hours. BNP (last 3 results) No results for input(s): PROBNP in the last 8760 hours. HbA1C: No results for input(s): HGBA1C in the last 72 hours. CBG: No results for input(s): GLUCAP in the last 168 hours. Lipid Profile: No results for input(s): CHOL, HDL, LDLCALC, TRIG, CHOLHDL, LDLDIRECT in the last 72 hours. Thyroid Function Tests: No results for input(s): TSH, T4TOTAL, FREET4, T3FREE, THYROIDAB in the last 72 hours. Anemia Panel: No results for input(s): VITAMINB12, FOLATE, FERRITIN, TIBC, IRON, RETICCTPCT in the last 72 hours. Sepsis Labs: Recent Labs  Lab 09/01/18 0142 09/01/18 0502  PROCALCITON  --  <0.10  LATICACIDVEN 1.65 0.7    Recent Results (from the past 240 hour(s))  Blood culture (routine x 2)     Status: None (Preliminary result)   Collection Time: 09/01/18  1:10 AM  Result Value Ref Range Status   Specimen Description BLOOD LEFT ARM  Final   Special Requests   Final    BOTTLES DRAWN AEROBIC AND ANAEROBIC Blood Culture results may not be optimal  due to an excessive volume of blood received in culture bottles   Culture   Final    NO GROWTH < 12 HOURS Performed at [facility], [place] [address] [place], [place] [address]    Report Status PENDING  Incomplete         Radiology Studies: Ct Chest W Contrast  Result Date: 09/01/2018 CLINICAL DATA:  Initial evaluation for acute left breast swelling, infection. EXAM: CT CHEST WITH CONTRAST TECHNIQUE: Multidetector CT imaging of the chest was performed during intravenous contrast administration. CONTRAST:  121m OMNIPAQUE IOHEXOL 300 MG/ML  SOLN COMPARISON:  Prior radiograph from 01/26/2009. FINDINGS: Cardiovascular: Intrathoracic aorta of normal caliber and appearance without aneurysm or other acute abnormality. Visualized great vessels within normal limits. Heart size normal. No pericardial effusion. Limited assessment of the pulmonary arterial tree grossly unremarkable. Mediastinum/Nodes: Visualized thyroid normal. No **Note De-Identified vi Obfusction** enlrged medistinl or hilr lymph nodes identified. Esophgus within norml limits. Lungs/Pleur: Trcheobronchil tree intct nd ptent. Lungs well inflted bilterlly. Lungs re cler bilterlly. No focl infiltrtes. No pulmonry edem or pleurl effusion. No pneumothorx. No worrisome pulmonry nodule or mss. Upper Abdomen: Visulized upper bdomen demonstrtes no significnt finding. Musculoskeletl: No frcture is seen. No cute osseous bnormlity. No discrete lytic or blstic osseous lesions. Thick-wlled hypodense collection positioned within the centrl spect of the subreolr left brest mesures pproximtely 4.8 x 7.0 x 5.1 cm (series 3, imge 52). Surrounding hzy inflmmtory strnding within the djcent ft of the left brest. Overlying skin thickening with slight nipple retrction. Given provided history, findings most concerning for infection with bscess. Few enlrged left xillry lymph nodes mesure up to pproximtely 19 mm, which my be rective.  Enlrged right xillry nodes mesure up to 19 mm s well. IMPRESSION: 1. 4.8 x 7.0 x 5.1 cm thick-wlled collection within the centrl spect of the left brest with surrounding inflmmtory chnges. Given provided history, findings most suspicious for cute infection with bscess formtion. Clinicl follow-up to resolution nd correltion with mmmogrphy nd/or ultrsound recommended, s  possible mss nd/or inflmmtory brest cncer could lso hve this ppernce. 2. Enlrged xillry denopthy, suspected to be rective. Clinicl follow-up to resolution recommended. 3. No other cute bnormlity within the chest. Electroniclly Signed   By: Jennine Bog M.D.   On: 09/01/2018 04:07        Scheduled Meds: . benztropine  1 mg Orl QHS  . choleclciferol  2,000 Units Orl Dily  . divlproex  1,500 mg Orl QHS  . FLUoxetine  60 mg Orl Dily  . levlbuterol  1.25 mg Nebuliztion Q6H  . lortdine  10 mg Orl QHS  . nicotine  21 mg Trnsderml Dily   Continuous Infusions: . sodium chloride 100 mL/hr t 09/01/18 0701  . [START ON 09/02/2018] cefTRIAXone (ROCEPHIN)  IV       LOS: 0 dys    Time spent: over 30 min    Fyrene Helper, MD [facility] [phone]   If 7PM-7AM, plese contct night-coverge [email or web address] Pssword [email or web address] 09/01/2018, 8:58 AM

## 2018-09-01 NOTE — Progress Notes (Signed)
Patient ID: Bryan Cummings, male   DOB: 1980-11-15, 37 y.o.   MRN: [id number] [facility] Progress Note:   * No surgery found *  Subjective: Mental status is baseline.  Main communication with grandmother.   Objective: Vital signs in last 24 hours: Temp:  [97.9 F (36.6 C)-99.1 F (37.3 C)] 97.9 F (36.6 C) (11/30 0624) Pulse Rate:  [88-120] 88 (11/30 0624) Resp:  [18-21] 21 (11/30 0530) BP: (105-155)/(68-87) 135/85 (11/30 0624) SpO2:  [90 %-98 %] 97 % (11/30 0624) Weight:  [166.9 kg-167.4 kg] 167.4 kg (11/30 0623)  Intake/Output from previous day: 11/29 0701 - 11/30 0700 In: 2100 [IV Piggyback:2100] Out: -  Intake/Output this shift: No intake/output data recorded.  Physical Exam: Work of breathing is not labored.  Left breast is indurated with scaly skin .    Lab Results:  Results for orders placed or performed during the hospital encounter of 09/01/18 (from the past 48 hour(s))  Blood culture (routine x 2)     Status: None (Preliminary result)   Collection Time: 09/01/18  1:10 AM  Result Value Ref Range   Specimen Description BLOOD LEFT ARM    Special Requests      BOTTLES DRAWN AEROBIC AND ANAEROBIC Blood Culture results may not be optimal due to an excessive volume of blood received in culture bottles   Culture      NO GROWTH < 12 HOURS Performed at [facility], [address] [place], [place] [address]    Report Status PENDING   CBC with Differential     Status: Abnormal   Collection Time: 09/01/18  1:38 AM  Result Value Ref Range   WBC 14.5 (H) 4.0 - 10.5 K/uL   RBC 4.44 4.22 - 5.81 MIL/uL   Hemoglobin 12.8 (L) 13.0 - 17.0 g/dL   HCT 62.1 30.8 - 65.7 %   MCV 90.5 80.0 - 100.0 fL   MCH 28.8 26.0 - 34.0 pg   MCHC 31.8 30.0 - 36.0 g/dL   RDW 84.6 (H) 96.2 - 95.2 %   Platelets 385 150 - 400 K/uL   nRBC 0.0 0.0 - 0.2 %   Neutrophils Relative % 63 %   Neutro Abs 9.2 (H) 1.7 - 7.7 K/uL   Lymphocytes Relative 19 %   Lymphs Abs 2.8 0.7 - 4.0 K/uL    Monocytes Relative 14 %   Monocytes Absolute 2.0 (H) 0.1 - 1.0 K/uL   Eosinophils Relative 2 %   Eosinophils Absolute 0.3 0.0 - 0.5 K/uL   Basophils Relative 0 %   Basophils Absolute 0.0 0.0 - 0.1 K/uL   Immature Granulocytes 2 %   Abs Immature Granulocytes 0.25 (H) 0.00 - 0.07 K/uL    Comment: Performed at [facility], [address] [place], [place] [address]  Comprehensive metabolic panel     Status: Abnormal   Collection Time: 09/01/18  1:38 AM  Result Value Ref Range   Sodium 135 135 - 145 mmol/L   Potassium 3.8 3.5 - 5.1 mmol/L   Chloride 97 (L) 98 - 111 mmol/L   CO2 26 22 - 32 mmol/L   Glucose, Bld 86 70 - 99 mg/dL   BUN 13 6 - 20 mg/dL   Creatinine, Ser 4.40 0.61 - 1.24 mg/dL   Calcium 9.6 8.9 - 10.2 mg/dL   Total Protein 7.6 6.5 - 8.1 g/dL   Albumin 3.9 3.5 - 5.0 g/dL   AST 16 15 - 41 U/L  ALT 18 0 - 44 U/L   Alkaline Phosphatase 54 38 - 126 U/L   Total Bilirubin 0.3 0.3 - 1.2 mg/dL   GFR calc non Af Amer >60 >60 mL/min   GFR calc Af Amer >60 >60 mL/min   Anion gap 12 5 - 15    Comment: Performed at [facility], [address], [place], [place] [address]  I-Stat CG4 Lactic Acid, ED     Status: None   Collection Time: 09/01/18  1:42 AM  Result Value Ref Range   Lactic Acid, Venous 1.65 0.5 - 1.9 mmol/L  Lactic acid, plasma     Status: None   Collection Time: 09/01/18  5:02 AM  Result Value Ref Range   Lactic Acid, Venous 0.7 0.5 - 1.9 mmol/L    Comment: Performed at [facility], [address] [place], [place] [address]  Procalcitonin     Status: None   Collection Time: 09/01/18  5:02 AM  Result Value Ref Range   Procalcitonin <0.10 ng/mL    Comment:        Interpretation: PCT (Procalcitonin) <= 0.5 ng/mL: Systemic infection (sepsis) is not likely. Local bacterial infection is possible. (NOTE)       Sepsis PCT Algorithm           Lower Respiratory Tract                                      Infection PCT Algorithm     ----------------------------     ----------------------------         PCT < 0.25 ng/mL                PCT < 0.10 ng/mL         Strongly encourage             Strongly discourage   discontinuation of antibiotics    initiation of antibiotics    ----------------------------     -----------------------------       PCT 0.25 - 0.50 ng/mL            PCT 0.10 - 0.25 ng/mL               OR       >80% decrease in PCT            Discourage initiation of                                            antibiotics      Encourage discontinuation           of antibiotics    ----------------------------     -----------------------------         PCT >= 0.50 ng/mL              PCT 0.26 - 0.50 ng/mL               AND        <80% decrease in PCT             Encourage initiation of  antibiotics       Encourage continuation           of antibiotics    ----------------------------     -----------------------------        PCT >= 0.50 ng/mL                  PCT > 0.50 ng/mL               AND         increase in PCT                  Strongly encourage                                      initiation of antibiotics    Strongly encourage escalation           of antibiotics                                     -----------------------------                                           PCT <= 0.25 ng/mL                                                 OR                                        > 80% decrease in PCT                                     Discontinue / Do not initiate                                             antibiotics Performed at [facility], [address], [place], [place] [address]   Protime-INR     Status: None   Collection Time: 09/01/18  5:08 AM  Result Value Ref Range   Prothrombin Time 12.4 11.4 - 15.2 seconds   INR 0.93     Comment: Performed at [facility], [address], [place], [place] [address]  APTT     Status: None    Collection Time: 09/01/18  5:08 AM  Result Value Ref Range   aPTT 32 24 - 36 seconds    Comment: Performed at [facility], [address], [place], [place] [address]    Radiology/Results: Ct Chest W Contrast  Result Date: 09/01/2018 CLINICAL DATA:  Initial evaluation for acute left breast swelling, infection. EXAM: CT CHEST WITH CONTRAST TECHNIQUE: Multidetector CT imaging of the chest was performed during intravenous contrast administration. CONTRAST:  OMNIPAQUE IOHEXOL 300 MG/ML  SOLN COMPARISON:  Prior radiograph from 01/26/2009. FINDINGS: Cardiovascular: Intrathoracic aorta  of normal caliber and appearance without aneurysm or other acute abnormality. Visualized great vessels within normal limits. Heart size normal. No pericardial effusion. Limited assessment of the pulmonary arterial tree grossly unremarkable. Mediastinum/Nodes: Visualized thyroid normal. No enlarged mediastinal or hilar lymph nodes identified. Esophagus within normal limits. Lungs/Pleura: Tracheobronchial tree intact and patent. Lungs well inflated bilaterally. Lungs are clear bilaterally. No focal infiltrates. No pulmonary edema or pleural effusion. No pneumothorax. No worrisome pulmonary nodule or mass. Upper Abdomen: Visualized upper abdomen demonstrates no significant finding. Musculoskeletal: No fracture is seen. No acute osseous abnormality. No discrete lytic or blastic osseous lesions. Thick-walled hypodense collection positioned within the central aspect of the subareolar left breast measures approximately 4.8 x 7.0 x 5.1 cm (series 3, image 52). Surrounding hazy inflammatory stranding within the adjacent fat of the left breast. Overlying skin thickening with slight nipple retraction. Given provided history, findings most concerning for infection with abscess. Few enlarged left axillary lymph nodes measure up to approximately 19 mm, which may be reactive. Enlarged right axillary nodes measure up to 19 mm as well.  IMPRESSION: 1. 4.8 x 7.0 x 5.1 cm thick-walled collection within the central aspect of the left breast with surrounding inflammatory changes. Given provided history, findings most suspicious for acute infection with abscess formation. Clinical follow-up to resolution and correlation with mammography and/or ultrasound recommended, as a possible mass and/or inflammatory breast cancer could also have this appearance. 2. Enlarged axillary adenopathy, suspected to be reactive. Clinical follow-up to resolution recommended. 3. No other acute abnormality within the chest. Electronically Signed   By: Rise MuBenjamin  McClintock M.D.   On: 09/01/2018 04:07    Anti-infectives: Anti-infectives (From admission, onward)   Start     Dose/Rate Route Frequency Ordered Stop   09/02/18 0200  cefTRIAXone (ROCEPHIN) 2 g in sodium chloride 0.9 % 100 mL IVPB     2 g 200 mL/hr over 30 Minutes Intravenous Every 24 hours 09/01/18 0457     09/01/18 0145  cefTRIAXone (ROCEPHIN) 2 g in sodium chloride 0.9 % 100 mL IVPB     2 g 200 mL/hr over 30 Minutes Intravenous  Once 09/01/18 0136 09/01/18 0237   09/01/18 0145  vancomycin (VANCOCIN) 2,500 mg in sodium chloride 0.9 % 500 mL IVPB     2,500 mg 250 mL/hr over 120 Minutes Intravenous  Once 09/01/18 0140 09/01/18 0437      Assessment/Plan: Problem List: Patient Active Problem List   Diagnosis Date Noted  . Abscess of male breast 09/01/2018  . Cellulitis of left breast 09/01/2018  . Sepsis (HCC) 09/01/2018  . HTN (hypertension) 09/01/2018  . Asthma   . OCD (obsessive compulsive disorder)   . Tourette's disease   . Tobacco abuse     Left breast mass/phlegmon.  IR contacted and [facility]  This will likelly occur on Monday.  Should consider keeping in house and sending to center on Monday or discharge and referring to center for ultrasound, mammogram, biopsy as needed.   * No surgery found *    LOS: 0 days   Matt B. Daphine DeutscherMartin, MD,  [facility]  [facility] [phone] [phone]  09/01/2018 10:46 AM

## 2018-09-01 NOTE — Progress Notes (Signed)
Pharmacy Antibiotic Note  Bryan Cummings is a 37 y.o. male admitted on 09/01/2018 with cellulitis.  Pharmacy has been consulted for vancomycin dosing.   Plan: Vancomycin 2500 mg IV x 1 then 1750 mg IV q12 F/u renal function, cultures and clinical course  Height: 6' (182.9 cm) Weight: (!) 368 lb (166.9 kg) IBW/kg (Calculated) : 77.6  Temp (24hrs), Avg:99.1 F (37.3 C), Min:99.1 F (37.3 C), Max:99.1 F (37.3 C)  Recent Labs  Lab 09/01/18 0138 09/01/18 0142  WBC 14.5*  --   CREATININE 0.99  --   LATICACIDVEN  --  1.65    Estimated Creatinine Clearance: 166.9 mL/min (by C-G formula based on SCr of 0.99 mg/dL).    No Known Allergies   Thank you for allowing pharmacy to be a part of this patient's care.  Bryan CageSeay, Bryan Cummings 09/01/2018 3:25 AM

## 2018-09-01 NOTE — Progress Notes (Signed)
Received patient from ED accompanied by Grandmother.  Patient AOx4, VSS, denies pain, and ambulatory, oriented to room, bed controls and call light.  Dr. Cliffton AstersWhite then came in to see patient.  Patient now resting on bed comfortably with Grandmother at bedside.

## 2018-09-01 NOTE — ED Triage Notes (Signed)
Patient c/o left breast infection x3 weeks; went to see provider and was given abx x2 week. States infection is worse.

## 2018-09-01 NOTE — ED Provider Notes (Signed)
[facility] Provider Note   CSN: [id number] Arrival date & time: 09/01/18  0055     History   Chief Complaint Chief Complaint  Patient presents with  . Wound Check    Left Breast    HPI Bryan Cummings is a 37 y.o. male.  HPI   37 yo N with h/o OCD, Tourette's, cognitive impairment here w/ left breast pain. Starting 3 weeks ago, pt notes increasing left breast pain, redness, and swelling. He was seen by his PCp 2 weeks ago and started on bactrim, which has not helped his sx. Over the last 2-3 days, the pain, swelling, and redness has worsened and is now severe. Pain is 8/10, aching, throbbing. He has associated skin breakdown and notes some spontaneous drainage today. He has been applying hydrogen peroxide and warm compresses to the area. Of note, he has a h/o R breast mass and cellulitis in past - was biopsied and non-cancerous. No h/o similar infection in left breast, however. Denies any trauma.   Past Medical History:  Diagnosis Date  . Asthma   . OCD (obsessive compulsive disorder)   . Tourette's disease     There are no active problems to display for this patient.   Past Surgical History:  Procedure Laterality Date  . I&D EXTREMITY  08/24/2012   Procedure: IRRIGATION AND DEBRIDEMENT EXTREMITY;  Surgeon: Dominica SeverinWilliam Gramig, MD;  Location: [facility] OR;  Service: Orthopedics;  Laterality: Left;  Irrigation and debridement left third finger        Home Medications    Prior to Admission medications   Medication Sig Start Date End Date Taking? Authorizing Provider  benztropine (COGENTIN) 1 MG tablet Take 1 mg by mouth at bedtime.    Yes [provider]  Cholecalciferol (VITAMIN D) 50 MCG (2000 UT) tablet Take 2,000 Units by mouth daily.   Yes [provider]  divalproex (DEPAKOTE ER) 500 MG 24 hr tablet Take 1,500 mg by mouth at bedtime. 08/22/18  Yes [provider]  FLUoxetine (PROZAC) 20 MG capsule Take 60 mg  by mouth daily. 08/07/18  Yes [provider]  haloperidol decanoate (HALDOL DECANOATE) 100 MG/ML injection Inject 50 mg into the muscle every 28 (twenty-eight) days.   Yes [provider]  loratadine (CLARITIN) 10 MG tablet Take 10 mg by mouth at bedtime.    Yes [provider]  losartan-hydrochlorothiazide (HYZAAR) 100-25 MG tablet Take 1 tablet by mouth daily. 08/24/18  Yes [provider]  sulfamethoxazole-trimethoprim (BACTRIM DS,SEPTRA DS) 800-160 MG tablet Take 1 tablet by mouth every 12 (twelve) hours. 08/23/18  Yes [provider]    Family History No family history on file.  Social History Social History   Tobacco Use  . Smoking status: Current Every Day Smoker    Packs/day: 2.00    Types: Cigarettes  . Smokeless tobacco: Never Used  Substance Use Topics  . Alcohol use: Yes  . Drug use: Yes    Types: Marijuana     Allergies   Patient has no known allergies.   Review of Systems Review of Systems  Constitutional: Positive for chills and fatigue. Negative for fever.  HENT: Negative for congestion and rhinorrhea.   Eyes: Negative for visual disturbance.  Respiratory: Negative for cough, shortness of breath and wheezing.   Cardiovascular: Negative for chest pain and leg swelling.  Gastrointestinal: Negative for abdominal pain, diarrhea, nausea and vomiting.  Genitourinary: Negative for dysuria and flank pain.  Musculoskeletal: Negative  for neck pain and neck stiffness.  Skin: Positive for rash and wound.  Allergic/Immunologic: Negative for immunocompromised state.  Neurological: Negative for syncope, weakness and headaches.  All other systems reviewed and are negative.    Physical Exam Updated Vital Signs BP 116/81   Pulse 93   Temp 99.1 F (37.3 C) (Oral)   Resp 18   Ht 6' (1.829 m)   Wt (!) 166.9 kg   SpO2 92%   BMI 49.91 kg/m   Physical Exam  Constitutional: He is oriented to person, place, and time. He  appears well-developed and well-nourished. No distress.  HENT:  Head: Normocephalic and atraumatic.  Eyes: Conjunctivae are normal.  Neck: Neck supple.  Cardiovascular: Regular rhythm and normal heart sounds. Tachycardia present. Exam reveals no friction rub.  No murmur heard. Pulmonary/Chest: Effort normal and breath sounds normal. No respiratory distress. He has no wheezes. He has no rales.  Abdominal: He exhibits no distension.  Musculoskeletal: He exhibits no edema.  Neurological: He is alert and oriented to person, place, and time. He exhibits normal muscle tone.  Skin: Skin is warm. Capillary refill takes less than 2 seconds.  Psychiatric: He has a normal mood and affect.  Nursing note and vitals reviewed.   Chest wall: Gynecomastia bilaterally. Left breast with significant asymmetric, firm induration and swelling.  Overlying skin blistering with significant TTP. No overt fluctuance though limited exam 2/2 pain.       ED Treatments / Results  Labs (all labs ordered are listed, but only abnormal results are displayed) Labs Reviewed  CBC WITH DIFFERENTIAL/PLATELET - Abnormal; Notable for the following components:      Result Value   WBC 14.5 (*)    Hemoglobin 12.8 (*)    RDW 15.7 (*)    Neutro Abs 9.2 (*)    Monocytes Absolute 2.0 (*)    Abs Immature Granulocytes 0.25 (*)    All other components within normal limits  COMPREHENSIVE METABOLIC PANEL - Abnormal; Notable for the following components:   Chloride 97 (*)    All other components within normal limits  CULTURE, BLOOD (ROUTINE X 2)  CULTURE, BLOOD (ROUTINE X 2)  I-STAT CG4 LACTIC ACID, ED    EKG None  Radiology Ct Chest W Contrast  Result Date: 09/01/2018 CLINICAL DATA:  Initial evaluation for acute left breast swelling, infection. EXAM: CT CHEST WITH CONTRAST TECHNIQUE: Multidetector CT imaging of the chest was performed during intravenous contrast administration. CONTRAST:  OMNIPAQUE IOHEXOL 300  MG/ML  SOLN COMPARISON:  Prior radiograph from 01/26/2009. FINDINGS: Cardiovascular: Intrathoracic aorta of normal caliber and appearance without aneurysm or other acute abnormality. Visualized great vessels within normal limits. Heart size normal. No pericardial effusion. Limited assessment of the pulmonary arterial tree grossly unremarkable. Mediastinum/Nodes: Visualized thyroid normal. No enlarged mediastinal or hilar lymph nodes identified. Esophagus within normal limits. Lungs/Pleura: Tracheobronchial tree intact and patent. Lungs well inflated bilaterally. Lungs are clear bilaterally. No focal infiltrates. No pulmonary edema or pleural effusion. No pneumothorax. No worrisome pulmonary nodule or mass. Upper Abdomen: Visualized upper abdomen demonstrates no significant finding. Musculoskeletal: No fracture is seen. No acute osseous abnormality. No discrete lytic or blastic osseous lesions. Thick-walled hypodense collection positioned within the central aspect of the subareolar left breast measures approximately 4.8 x 7.0 x 5.1 cm (series 3, image 52). Surrounding hazy inflammatory stranding within the adjacent fat of the left breast. Overlying skin thickening with slight nipple retraction. Given provided history, findings most concerning for infection with abscess. Few  enlarged left axillary lymph nodes measure up to approximately 19 mm, which may be reactive. Enlarged right axillary nodes measure up to 19 mm as well. IMPRESSION: 1. 4.8 x 7.0 x 5.1 cm thick-walled collection within the central aspect of the left breast with surrounding inflammatory changes. Given provided history, findings most suspicious for acute infection with abscess formation. Clinical follow-up to resolution and correlation with mammography and/or ultrasound recommended, as a possible mass and/or inflammatory breast cancer could also have this appearance. 2. Enlarged axillary adenopathy, suspected to be reactive. Clinical follow-up to  resolution recommended. 3. No other acute abnormality within the chest. Electronically Signed   By: Rise Mu M.D.   On: 09/01/2018 04:07    Procedures Procedures (including critical care time)  Emergency Ultrasound Study:   Angiocath insertion Performed by: Dollene Cleveland Consent: Verbal consent/emergent consent obtained. Risks and benefits: risks, benefits and alternatives were discussed Immediately prior to procedure the correct patient, procedure, equipment, support staff and site/side marked as needed.  Indication: difficult IV access Preparation: Patient was prepped and draped in the usual sterile fashion. Sterile gel was used for this procedure and the ultrasound probe was sterilized prior to use. Vein Location: Right forearm vein was visualized during assessment for potential access sites and was found to be patent/ easily compressed with linear ultrasound.  The needle was visualized with real-time ultrasound and guided into the vein. Gauge: 18 Image saved and stored.  Normal blood return.   Patient tolerance: Patient tolerated the procedure well with no immediate complications.       Medications Ordered in ED Medications  vancomycin (VANCOCIN) 2,500 mg in sodium chloride 0.9 % 500 mL IVPB (2,500 mg Intravenous New Bag/Given 09/01/18 0237)  sodium chloride 0.9 % bolus 1,000 mL (has no administration in time range)  cefTRIAXone (ROCEPHIN) 2 g in sodium chloride 0.9 % 100 mL IVPB (0 g Intravenous Stopped 09/01/18 0237)  sodium chloride 0.9 % bolus 1,000 mL (0 mLs Intravenous Stopped 09/01/18 0321)  HYDROmorphone (DILAUDID) injection 1 mg (1 mg Intravenous Given 09/01/18 0150)  ondansetron (ZOFRAN) injection 4 mg (4 mg Intravenous Given 09/01/18 0150)  iohexol (OMNIPAQUE) 300 MG/ML solution 100 mL (100 mLs Intravenous Contrast Given 09/01/18 0335)     Initial Impression / Assessment and Plan / ED Course  I have reviewed the triage vital signs and the nursing  notes.  Pertinent labs & imaging results that were available during my care of the patient were reviewed by me and considered in my medical decision making (see chart for details).     37 yo M with history as above here with left breast infection.. Pt tachycardic on arrival but afebrile. Labs, imaging are concerning for large breast abscess w/ surrounding cellulitis. Pt given Vanc/Rocephin, will consult with Dr. Cliffton Asters of [facility] and admit to medicine. Pt updated and in agreement. LA is normal. IVF given.  Discussed with Dr. Cliffton Asters - surgery will see pt. Admit to medicine.  Final Clinical Impressions(s) / ED Diagnoses   Final diagnoses:  Cellulitis of breast  Left breast abscess    ED Discharge Orders    None       Shaune Pollack, MD 09/01/18 0425

## 2018-09-01 NOTE — H&P (Addendum)
History and Physical    Bryan Cummings HFW:[id number] DOB: 1980/10/07 DOA: 09/01/2018  Referring MD/NP/PA:   PCP: [facility], [facility]   Patient coming from:  The patient is coming from home.  At baseline, pt is independent for most of ADL.        Chief Complaint: left breast pain  HPI: Bryan Cummings is a 37 y.o. male with medical history significant of asthma, Tourette's disease, OCD, tobacco abuse, obesity, right breast cellulitis, who presents with left breast pain.  Patient states that he started having left breast pain 3 weeks ago, which has been progressively getting worse.  Left breast also becomes erythematous and swollon.  Associated with mild fever and chills.  The pain is constant, 9 out of 10 severity, sharp, nonradiating.  Patient was started with Bactrim 8 days ago by PCP, without improvement.  He has skin breakdown in the center of left breast. He noted some spontaneous drainage today. He has been applying hydrogen peroxide and warm compresses to the area at home. Of note, he had h/o R breast mass and cellulitis in past , which was biopsied and non-cancerous.  Patient denies any chest pain, shortness breath, cough.  No nausea vomiting, diarrhea, abdominal pain, symptoms of UTI or unilateral weakness.  ED Course: pt was found to have WBC 14.5, lactic acid 1.65, electrolytes renal function okay, temperature 99.1, tachycardia, no tachypnea, oxygen saturation 92 to 98% on room air.  CT of the chest showed possible left breast abscess.  Patient is admitted to [place] bed as inpatient.  General surgeon, Dr. Dema Severin was consulted.  Review of Systems:   General: has fevers, chills, no body weight gain, has fatigue HEENT: no blurry vision, hearing changes or sore throat Respiratory: no dyspnea, coughing, wheezing CV: no chest pain, no palpitations GI: no nausea, vomiting, abdominal pain, diarrhea, constipation GU: no dysuria, burning on urination, increased urinary  frequency, hematuria  Ext: no leg edema Neuro: no unilateral weakness, numbness, or tingling, no vision change or hearing loss Skin: Left breast is painful, swollen and erythematous with skin breakdown in the center MSK: No muscle spasm, no deformity, no limitation of range of movement in spin Heme: No easy bruising.  Travel history: No recent long distant travel.  Allergy: No Known Allergies  Past Medical History:  Diagnosis Date  . Asthma   . OCD (obsessive compulsive disorder)   . Tobacco abuse   . Tourette's disease     Past Surgical History:  Procedure Laterality Date  . I&D EXTREMITY  08/24/2012   Procedure: IRRIGATION AND DEBRIDEMENT EXTREMITY;  Surgeon: Roseanne Kaufman, MD;  Location: [place];  Service: Orthopedics;  Laterality: Left;  Irrigation and debridement left third finger    Social History:  reports that he has been smoking cigarettes. He has been smoking about 2.00 packs per day. He has never used smokeless tobacco. He reports that he drinks alcohol. He reports that he has current or past drug history. Drug: Marijuana.  Family History:  Family History  Problem Relation Age of Onset  . Mental illness Mother      Prior to Admission medications   Medication Sig Start Date End Date Taking? Authorizing Provider  benztropine (COGENTIN) 1 MG tablet Take 1 mg by mouth at bedtime.    Yes [provider]  Cholecalciferol (VITAMIN D) 50 MCG (2000 UT) tablet Take 2,000 Units by mouth daily.   Yes [provider]  divalproex (DEPAKOTE ER) 500 MG 24 hr tablet  Take 1,500 mg by mouth at bedtime. 08/22/18  Yes [provider]  FLUoxetine (PROZAC) 20 MG capsule Take 60 mg by mouth daily. 08/07/18  Yes [provider]  haloperidol decanoate (HALDOL DECANOATE) 100 MG/ML injection Inject 50 mg into the muscle every 28 (twenty-eight) days.   Yes [provider]  loratadine (CLARITIN) 10 MG tablet Take 10 mg by mouth at bedtime.    Yes  [provider]  losartan-hydrochlorothiazide (HYZAAR) 100-25 MG tablet Take 1 tablet by mouth daily. 08/24/18  Yes [provider]  sulfamethoxazole-trimethoprim (BACTRIM DS,SEPTRA DS) 800-160 MG tablet Take 1 tablet by mouth every 12 (twelve) hours. 08/23/18  Yes [provider]    Physical Exam: Vitals:   09/01/18 0106 09/01/18 0318 09/01/18 0418 09/01/18 0530  BP: (!) 155/87 105/68 116/81 135/76  Pulse: (!) 120 94 93 89  Resp:   18 (!) 21  Temp: 99.1 F (37.3 C)     TempSrc: Oral     SpO2: 98% 95% 92% 90%  Weight:      Height:       General: Not in acute distress HEENT:       Eyes: PERRL, EOMI, no scleral icterus.       ENT: No discharge from the ears and nose, no pharynx injection, no tonsillar enlargement.        Neck: No JVD, no bruit, no mass felt. Heme: No neck lymph node enlargement. Cardiac: S1/S2, RRR, No murmurs, No gallops or rubs. Respiratory: No rales, wheezing, rhonchi or rubs. GI: Soft, nondistended, nontender, no rebound pain, no organomegaly, BS present. GU: No hematuria Ext: No pitting leg edema bilaterally. 2+DP/PT pulse bilaterally. Musculoskeletal: No joint deformities, No joint redness or warmth, no limitation of ROM in spin. Skin: Left breast is tenderness, swollen and erythematous with skin breakdown in the center  Neuro: Alert, oriented X3, cranial nerves II-XII grossly intact, moves all extremities normally. Psych: Patient is not psychotic, no suicidal or hemocidal ideation.  Labs on Admission: I have personally reviewed following labs and imaging studies  CBC: Recent Labs  Lab 09/01/18 0138  WBC 14.5*  NEUTROABS 9.2*  HGB 12.8*  HCT 40.2  MCV 90.5  PLT 962   Basic Metabolic Panel: Recent Labs  Lab 09/01/18 0138  NA 135  K 3.8  CL 97*  CO2 26  GLUCOSE 86  BUN 13  CREATININE 0.99  CALCIUM 9.6   GFR: Estimated Creatinine Clearance: 166.9 mL/min (by C-G formula based on SCr of 0.99 mg/dL). Liver  Function Tests: Recent Labs  Lab 09/01/18 0138  AST 16  ALT 18  ALKPHOS 54  BILITOT 0.3  PROT 7.6  ALBUMIN 3.9   No results for input(s): LIPASE, AMYLASE in the last 168 hours. No results for input(s): AMMONIA in the last 168 hours. Coagulation Profile: No results for input(s): INR, PROTIME in the last 168 hours. Cardiac Enzymes: No results for input(s): CKTOTAL, CKMB, CKMBINDEX, TROPONINI in the last 168 hours. BNP (last 3 results) No results for input(s): PROBNP in the last 8760 hours. HbA1C: No results for input(s): HGBA1C in the last 72 hours. CBG: No results for input(s): GLUCAP in the last 168 hours. Lipid Profile: No results for input(s): CHOL, HDL, LDLCALC, TRIG, CHOLHDL, LDLDIRECT in the last 72 hours. Thyroid Function Tests: No results for input(s): TSH, T4TOTAL, FREET4, T3FREE, THYROIDAB in the last 72 hours. Anemia Panel: No results for input(s): VITAMINB12, FOLATE, FERRITIN, TIBC, IRON, RETICCTPCT in the last 72 hours. Urine analysis: No  results found for: COLORURINE, APPEARANCEUR, LABSPEC, PHURINE, GLUCOSEU, HGBUR, BILIRUBINUR, KETONESUR, PROTEINUR, UROBILINOGEN, NITRITE, LEUKOCYTESUR Sepsis Labs: '@LABRCNTIP'$ (procalcitonin:4,lacticidven:4) )No results found for this or any previous visit (from the past 240 hour(s)).   Radiological Exams on Admission: Ct Chest W Contrast  Result Date: 09/01/2018 CLINICAL DATA:  Initial evaluation for acute left breast swelling, infection. EXAM: CT CHEST WITH CONTRAST TECHNIQUE: Multidetector CT imaging of the chest was performed during intravenous contrast administration. CONTRAST:  142m OMNIPAQUE IOHEXOL 300 MG/ML  SOLN COMPARISON:  Prior radiograph from 01/26/2009. FINDINGS: Cardiovascular: Intrathoracic aorta of normal caliber and appearance without aneurysm or other acute abnormality. Visualized great vessels within normal limits. Heart size normal. No pericardial effusion. Limited assessment of the pulmonary arterial tree  grossly unremarkable. Mediastinum/Nodes: Visualized thyroid normal. No enlarged mediastinal or hilar lymph nodes identified. Esophagus within normal limits. Lungs/Pleura: Tracheobronchial tree intact and patent. Lungs well inflated bilaterally. Lungs are clear bilaterally. No focal infiltrates. No pulmonary edema or pleural effusion. No pneumothorax. No worrisome pulmonary nodule or mass. Upper Abdomen: Visualized upper abdomen demonstrates no significant finding. Musculoskeletal: No fracture is seen. No acute osseous abnormality. No discrete lytic or blastic osseous lesions. Thick-walled hypodense collection positioned within the central aspect of the subareolar left breast measures approximately 4.8 x 7.0 x 5.1 cm (series 3, image 52). Surrounding hazy inflammatory stranding within the adjacent fat of the left breast. Overlying skin thickening with slight nipple retraction. Given provided history, findings most concerning for infection with abscess. Few enlarged left axillary lymph nodes measure up to approximately 19 mm, which may be reactive. Enlarged right axillary nodes measure up to 19 mm as well. IMPRESSION: 1. 4.8 x 7.0 x 5.1 cm thick-walled collection within the central aspect of the left breast with surrounding inflammatory changes. Given provided history, findings most suspicious for acute infection with abscess formation. Clinical follow-up to resolution and correlation with mammography and/or ultrasound recommended, as a possible mass and/or inflammatory breast cancer could also have this appearance. 2. Enlarged axillary adenopathy, suspected to be reactive. Clinical follow-up to resolution recommended. 3. No other acute abnormality within the chest. Electronically Signed   By: BJeannine BogaM.D.   On: 09/01/2018 04:07     EKG: Reviewed independently, low voltage, early R wave progression, nonspecific T wave change.  Assessment/Plan Principal Problem:   Abscess of male breast Active  Problems:   Asthma   OCD (obsessive compulsive disorder)   Tourette's disease   Cellulitis of left breast   Tobacco abuse   Sepsis ([place]gin   HTN (hypertension)   Sepsis due to abscess and cellulitis of male breast: Patient failed outpatient antibiotic treatment.  Patient meets criteria for sepsis with leukocytosis and tachycardia.  Lactic acid is normal.  Currently hemodynamically stable.  General surgeon, Dr. WDema Severinwas consulted.  - will admit to Med-surg as inpt - Empiric antimicrobial treatment with vancomycin, Rocephin - PRN Zofran for nausea, Dilaudid and Percocet for pain - Blood cultures x 2  - ESR and CRP - wound care consult - will get Procalcitonin and trend lactic acid levels per sepsis protocol. - IVF: 3.5 L of NS bolus in ED, followed by 100 cc/h - Keep NPO now in case pt need go to OR - f/u surgeon's recommendation - need to get biopsy to r/o malignancy  HTN: Blood pressure 116/81 -hold Hyzaar due to risk of developing hypotension secondary to sepsis -IV hydralazine prn  Asthma: stable -PRN Xopenex nebulizer -As needed Mucinex for cough  OCD and Tourette's disease: Patient is  calm.  Patient is getting harder injection q. 28 days, last dose was on 11/6. -Continue Depakote, Prozac, Cogentin  Tobacco abuse: -Did counseling about importance of quitting smoking -Nicotine patch   Inpatient status:  # Patient requires inpatient status due to high intensity of service, high risk for further deterioration and high frequency of surveillance required.  I certify that at the point of admission it is my clinical judgment that the patient will require inpatient hospital care spanning beyond 2 midnights from the point of admission.  . This patient has multiple chronic comorbidities including asthma, Tourette's disease, OCD, tobacco abuse, obesity . Now patient has presenting symptoms include: Left breast tenderness, erythema, swelling, skin breakdown, fever, chills . The  worrisome physical exam findings include tenderness, erythema, swelling and skin breakdown in left breast . The initial radiographic and laboratory data are worrisome because of leukocytosis.  CT scan of chest showed abscess in the left breast. . .Current medical needs: please see my assessment and plan . Predictability of an adverse outcome (risk): Patient presents with sepsis due to abscess and cellulitis in the left breast. He failed outpatient antibiotic treatment.  Most likely need surgical I&D.  Most likely need to stay in hospital for more than 2 days.     DVT ppx: SCD Code Status: Full code Family Communication:   Yes, patient's grandmother and aunt at bed side Disposition Plan:  Anticipate discharge back to previous home environment Consults called: Education officer, environmental, Dr. Hulen Skains Admission status: medical floor/inpt  Date of Service 09/01/2018    Ivor Costa [facility] Pager [phone]  If 7PM-7AM, please contact night-coverage [email or web address] Password [facility] 09/01/2018, 5:34 AM

## 2018-09-01 NOTE — Consult Note (Signed)
CC: Left breast swelling, abscess found on CT - consult by ED MD Dr. Erma Heritage  HPI: Bryan Cummings is an 37 y.o. male with hx of asthma, OCD, tobacco abuse, Tourette's whom presented to the ED today with 3 wk hx of left breast pain and redness. +Chills. The pain is sharp and constant. He has tried bactrim for the past 8d under care of PCP but no improvement. He Grandma whom is his caregiver has also been pouring peroxide over his breast. They state that it has appeared abnormal and firm for a couple weeks then the skin started to change in the past 3 weeks. He denies changes in his weight  He has a hx of R breast mass/cellulitis in the past treated, biopsied and ruled benign.  They deny any known family history of breast cancer  Past Medical History:  Diagnosis Date  . Asthma   . OCD (obsessive compulsive disorder)   . Tobacco abuse   . Tourette's disease     Past Surgical History:  Procedure Laterality Date  . I&D EXTREMITY  08/24/2012   Procedure: IRRIGATION AND DEBRIDEMENT EXTREMITY;  Surgeon: Dominica Severin, MD;  Location: [facility] OR;  Service: Orthopedics;  Laterality: Left;  Irrigation and debridement left third finger    Family History  Problem Relation Age of Onset  . Mental illness Mother     Social:  reports that he has been smoking cigarettes. He has been smoking about 2.00 packs per day. He has never used smokeless tobacco. He reports that he drinks alcohol. He reports that he has current or past drug history. Drug: Marijuana.  Allergies: No Known Allergies  Medications: I have reviewed the patient's current medications.  Results for orders placed or performed during the hospital encounter of 09/01/18 (from the past 48 hour(s))  CBC with Differential     Status: Abnormal   Collection Time: 09/01/18  1:38 AM  Result Value Ref Range   WBC 14.5 (H) 4.0 - 10.5 K/uL   RBC 4.44 4.22 - 5.81 MIL/uL   Hemoglobin 12.8 (L) 13.0 - 17.0 g/dL   HCT 16.1 09.6 - 04.5 %   MCV 90.5  80.0 - 100.0 fL   MCH 28.8 26.0 - 34.0 pg   MCHC 31.8 30.0 - 36.0 g/dL   RDW 40.9 (H) 81.1 - 91.4 %   Platelets 385 150 - 400 K/uL   nRBC 0.0 0.0 - 0.2 %   Neutrophils Relative % 63 %   Neutro Abs 9.2 (H) 1.7 - 7.7 K/uL   Lymphocytes Relative 19 %   Lymphs Abs 2.8 0.7 - 4.0 K/uL   Monocytes Relative 14 %   Monocytes Absolute 2.0 (H) 0.1 - 1.0 K/uL   Eosinophils Relative 2 %   Eosinophils Absolute 0.3 0.0 - 0.5 K/uL   Basophils Relative 0 %   Basophils Absolute 0.0 0.0 - 0.1 K/uL   Immature Granulocytes 2 %   Abs Immature Granulocytes 0.25 (H) 0.00 - 0.07 K/uL    Comment: Performed at [facility], [address] [place], [place] [address]  Comprehensive metabolic panel     Status: Abnormal   Collection Time: 09/01/18  1:38 AM  Result Value Ref Range   Sodium 135 135 - 145 mmol/L   Potassium 3.8 3.5 - 5.1 mmol/L   Chloride 97 (L) 98 - 111 mmol/L   CO2 26 22 - 32 mmol/L   Glucose, Bld 86 70 - 99 mg/dL   BUN 13 6 - 20  mg/dL   Creatinine, Ser 1.61 0.61 - 1.24 mg/dL   Calcium 9.6 8.9 - 09.6 mg/dL   Total Protein 7.6 6.5 - 8.1 g/dL   Albumin 3.9 3.5 - 5.0 g/dL   AST 16 15 - 41 U/L   ALT 18 0 - 44 U/L   Alkaline Phosphatase 54 38 - 126 U/L   Total Bilirubin 0.3 0.3 - 1.2 mg/dL   GFR calc non Af Amer >60 >60 mL/min   GFR calc Af Amer >60 >60 mL/min   Anion gap 12 5 - 15    Comment: Performed at [facility], [address] [place], [place] [address]  I-Stat CG4 Lactic Acid, ED     Status: None   Collection Time: 09/01/18  1:42 AM  Result Value Ref Range   Lactic Acid, Venous 1.65 0.5 - 1.9 mmol/L  Protime-INR     Status: None   Collection Time: 09/01/18  5:08 AM  Result Value Ref Range   Prothrombin Time 12.4 11.4 - 15.2 seconds   INR 0.93     Comment: Performed at [facility], [address], [place], [place] [address]  APTT     Status: None   Collection Time: 09/01/18  5:08 AM  Result Value Ref Range   aPTT 32 24 - 36 seconds    Comment: Performed  at [facility], [address], [place], [place] [address]    Ct Chest W Contrast  Result Date: 09/01/2018 CLINICAL DATA:  Initial evaluation for acute left breast swelling, infection. EXAM: CT CHEST WITH CONTRAST TECHNIQUE: Multidetector CT imaging of the chest was performed during intravenous contrast administration. CONTRAST:  OMNIPAQUE IOHEXOL 300 MG/ML  SOLN COMPARISON:  Prior radiograph from 01/26/2009. FINDINGS: Cardiovascular: Intrathoracic aorta of normal caliber and appearance without aneurysm or other acute abnormality. Visualized great vessels within normal limits. Heart size normal. No pericardial effusion. Limited assessment of the pulmonary arterial tree grossly unremarkable. Mediastinum/Nodes: Visualized thyroid normal. No enlarged mediastinal or hilar lymph nodes identified. Esophagus within normal limits. Lungs/Pleura: Tracheobronchial tree intact and patent. Lungs well inflated bilaterally. Lungs are clear bilaterally. No focal infiltrates. No pulmonary edema or pleural effusion. No pneumothorax. No worrisome pulmonary nodule or mass. Upper Abdomen: Visualized upper abdomen demonstrates no significant finding. Musculoskeletal: No fracture is seen. No acute osseous abnormality. No discrete lytic or blastic osseous lesions. Thick-walled hypodense collection positioned within the central aspect of the subareolar left breast measures approximately 4.8 x 7.0 x 5.1 cm (series 3, image 52). Surrounding hazy inflammatory stranding within the adjacent fat of the left breast. Overlying skin thickening with slight nipple retraction. Given provided history, findings most concerning for infection with abscess. Few enlarged left axillary lymph nodes measure up to approximately 19 mm, which may be reactive. Enlarged right axillary nodes measure up to 19 mm as well. IMPRESSION: 1. 4.8 x 7.0 x 5.1 cm thick-walled collection within the central aspect of the left breast with surrounding  inflammatory changes. Given provided history, findings most suspicious for acute infection with abscess formation. Clinical follow-up to resolution and correlation with mammography and/or ultrasound recommended, as a possible mass and/or inflammatory breast cancer could also have this appearance. 2. Enlarged axillary adenopathy, suspected to be reactive. Clinical follow-up to resolution recommended. 3. No other acute abnormality within the chest. Electronically Signed   By: Rise Mu M.D.   On: 09/01/2018 04:07    ROS - all of the below systems have been reviewed with the  patient and positives are indicated with bold text General: chills, fever or night sweats Eyes: blurry vision or double vision ENT: epistaxis or sore throat Allergy/Immunology: itchy/watery eyes or nasal congestion Hematologic/Lymphatic: bleeding problems, blood clots or swollen lymph nodes Endocrine: temperature intolerance or unexpected weight changes Breast: new or changing breast lumps or nipple discharge Resp: cough, shortness of breath, or wheezing CV: chest pain or dyspnea on exertion GI: as per HPI GU: dysuria, trouble voiding, or hematuria MSK: joint pain or joint stiffness Neuro: TIA or stroke symptoms Derm: pruritus and skin lesion changes Psych: anxiety and depression  PE Blood pressure 135/76, pulse 89, temperature 99.1 F (37.3 C), temperature source Oral, resp. rate (!) 21, height 6' (1.829 m), weight (!) 166.9 kg, SpO2 90 %. Constitutional: NAD; conversant; no deformities Eyes: Moist conjunctiva; no lid lag; anicteric; PERRL Neck: Trachea midline; no thyromegaly Lungs: Normal respiratory effort; no tactile fremitus CV: RRR; no palpable thrills; no pitting edema Breast: Left breast with large baseball sized area of firm mass centered around what was the nipple. The entire nipple/areola complex has inverted. Small nodules palpable in left axilla concerning for lymph nodes. Right breast normal in  exam. GI: Abd obese, soft, NT/ND; no palpable hepatosplenomegaly MSK: Normal gait; no clubbing/cyanosis Psychiatric: Appropriate affect; alert and oriented x3 Lymphatic: No palpable cervical or axillary lymphadenopathy  Results for orders placed or performed during the hospital encounter of 09/01/18 (from the past 48 hour(s))  CBC with Differential     Status: Abnormal   Collection Time: 09/01/18  1:38 AM  Result Value Ref Range   WBC 14.5 (H) 4.0 - 10.5 K/uL   RBC 4.44 4.22 - 5.81 MIL/uL   Hemoglobin 12.8 (L) 13.0 - 17.0 g/dL   HCT 40.940.2 81.139.0 - 91.452.0 %   MCV 90.5 80.0 - 100.0 fL   MCH 28.8 26.0 - 34.0 pg   MCHC 31.8 30.0 - 36.0 g/dL   RDW 78.215.7 (H) 95.611.5 - 21.315.5 %   Platelets 385 150 - 400 K/uL   nRBC 0.0 0.0 - 0.2 %   Neutrophils Relative % 63 %   Neutro Abs 9.2 (H) 1.7 - 7.7 K/uL   Lymphocytes Relative 19 %   Lymphs Abs 2.8 0.7 - 4.0 K/uL   Monocytes Relative 14 %   Monocytes Absolute 2.0 (H) 0.1 - 1.0 K/uL   Eosinophils Relative 2 %   Eosinophils Absolute 0.3 0.0 - 0.5 K/uL   Basophils Relative 0 %   Basophils Absolute 0.0 0.0 - 0.1 K/uL   Immature Granulocytes 2 %   Abs Immature Granulocytes 0.25 (H) 0.00 - 0.07 K/uL    Comment: Performed at [facility], [address] [place], [place] [id number]  Comprehensive metabolic panel     Status: Abnormal   Collection Time: 09/01/18  1:38 AM  Result Value Ref Range   Sodium 135 135 - 145 mmol/L   Potassium 3.8 3.5 - 5.1 mmol/L   Chloride 97 (L) 98 - 111 mmol/L   CO2 26 22 - 32 mmol/L   Glucose, Bld 86 70 - 99 mg/dL   BUN 13 6 - 20 mg/dL   Creatinine, Ser 8.460.99 0.61 - 1.24 mg/dL   Calcium 9.6 8.9 - 96.210.3 mg/dL   Total Protein 7.6 6.5 - 8.1 g/dL   Albumin 3.9 3.5 - 5.0 g/dL   AST 16 15 - 41 U/L   ALT 18 0 - 44 U/L   Alkaline Phosphatase 54 38 - 126 U/L  Total Bilirubin 0.3 0.3 - 1.2 mg/dL   GFR calc non Af Amer >60 >60 mL/min   GFR calc Af Amer >60 >60 mL/min   Anion gap 12 5 - 15    Comment: Performed at [facility], [address], [place], [place] [address]  I-Stat CG4 Lactic Acid, ED     Status: None   Collection Time: 09/01/18  1:42 AM  Result Value Ref Range   Lactic Acid, Venous 1.65 0.5 - 1.9 mmol/L  Protime-INR     Status: None   Collection Time: 09/01/18  5:08 AM  Result Value Ref Range   Prothrombin Time 12.4 11.4 - 15.2 seconds   INR 0.93     Comment: Performed at [facility], [address] [place], [place] [address]  APTT     Status: None   Collection Time: 09/01/18  5:08 AM  Result Value Ref Range   aPTT 32 24 - 36 seconds    Comment: Performed at [facility], [address], [place], [place] [address]    Ct Chest W Contrast  Result Date: 09/01/2018 CLINICAL DATA:  Initial evaluation for acute left breast swelling, infection. EXAM: CT CHEST WITH CONTRAST TECHNIQUE: Multidetector CT imaging of the chest was performed during intravenous contrast administration. CONTRAST:  OMNIPAQUE IOHEXOL 300 MG/ML  SOLN COMPARISON:  Prior radiograph from 01/26/2009. FINDINGS: Cardiovascular: Intrathoracic aorta of normal caliber and appearance without aneurysm or other acute abnormality. Visualized great vessels within normal limits. Heart size normal. No pericardial effusion. Limited assessment of the pulmonary arterial tree grossly unremarkable. Mediastinum/Nodes: Visualized thyroid normal. No enlarged mediastinal or hilar lymph nodes identified. Esophagus within normal limits. Lungs/Pleura: Tracheobronchial tree intact and patent. Lungs well inflated bilaterally. Lungs are clear bilaterally. No focal infiltrates. No pulmonary edema or pleural effusion. No pneumothorax. No worrisome pulmonary nodule or mass. Upper Abdomen: Visualized upper abdomen demonstrates no significant finding. Musculoskeletal: No fracture is seen. No acute osseous abnormality. No discrete lytic or blastic osseous lesions. Thick-walled hypodense collection positioned within the central aspect of the  subareolar left breast measures approximately 4.8 x 7.0 x 5.1 cm (series 3, image 52). Surrounding hazy inflammatory stranding within the adjacent fat of the left breast. Overlying skin thickening with slight nipple retraction. Given provided history, findings most concerning for infection with abscess. Few enlarged left axillary lymph nodes measure up to approximately 19 mm, which may be reactive. Enlarged right axillary nodes measure up to 19 mm as well. IMPRESSION: 1. 4.8 x 7.0 x 5.1 cm thick-walled collection within the central aspect of the left breast with surrounding inflammatory changes. Given provided history, findings most suspicious for acute infection with abscess formation. Clinical follow-up to resolution and correlation with mammography and/or ultrasound recommended, as a possible mass and/or inflammatory breast cancer could also have this appearance. 2. Enlarged axillary adenopathy, suspected to be reactive. Clinical follow-up to resolution recommended. 3. No other acute abnormality within the chest. Electronically Signed   By: Rise Mu M.D.   On: 09/01/2018 04:07   A/P: SHIV SHUEY is an 37 y.o. male with Left breast nipple inversion and mass and imaging concerning for associatead abscess in subareolar region  -He has been admitted; continue broad spec IV abx -Will consult interventional radiology to aspirate/drain and potentially perform core biopsy as well. All of this is concerning for a breast cancer so would avoid incision/drainage -I discussed all of this with the patient and his grandmother who voiced understanding  Sharon Mt. Dema Severin, M.D. [facility]

## 2018-09-02 LAB — CBC
HCT: 38.4 % — ABNORMAL LOW (ref 39.0–52.0)
Hemoglobin: 12.2 g/dL — ABNORMAL LOW (ref 13.0–17.0)
MCH: 28.2 pg (ref 26.0–34.0)
MCHC: 31.8 g/dL (ref 30.0–36.0)
MCV: 88.7 fL (ref 80.0–100.0)
Platelets: 367 10*3/uL (ref 150–400)
RBC: 4.33 MIL/uL (ref 4.22–5.81)
RDW: 15.4 % (ref 11.5–15.5)
WBC: 11.8 10*3/uL — ABNORMAL HIGH (ref 4.0–10.5)
nRBC: 0 % (ref 0.0–0.2)

## 2018-09-02 LAB — MAGNESIUM: Magnesium: 2.1 mg/dL (ref 1.7–2.4)

## 2018-09-02 LAB — COMPREHENSIVE METABOLIC PANEL
ALT: 12 U/L (ref 0–44)
AST: 19 U/L (ref 15–41)
Albumin: 3 g/dL — ABNORMAL LOW (ref 3.5–5.0)
Alkaline Phosphatase: 54 U/L (ref 38–126)
Anion gap: 12 (ref 5–15)
BUN: 7 mg/dL (ref 6–20)
CO2: 27 mmol/L (ref 22–32)
Calcium: 8.6 mg/dL — ABNORMAL LOW (ref 8.9–10.3)
Chloride: 99 mmol/L (ref 98–111)
Creatinine, Ser: 0.87 mg/dL (ref 0.61–1.24)
GFR calc Af Amer: >60 mL/min (ref >60)
GFR calc non Af Amer: >60 mL/min (ref >60)
GLUCOSE: 70 mg/dL (ref 70–99)
Potassium: 4.1 mmol/L (ref 3.5–5.1)
Sodium: 138 mmol/L (ref 135–145)
Total Bilirubin: 0.8 mg/dL (ref 0.3–1.2)
Total Protein: 7 g/dL (ref 6.5–8.1)

## 2018-09-02 MED ORDER — SULFAMETHOXAZOLE-TRIMETHOPRIM 800-160 MG PO TABS: 1.0000 | ORAL_TABLET | Freq: Two times a day (BID) | ORAL | 0 refills | Status: AC

## 2018-09-02 NOTE — Discharge Summary (Signed)
Physician Discharge Summary  Bryan Cummings ZOX:[id number] DOB: [birth year] DOA: 09/01/2018  PCP: [facility]  Admit date: 09/01/2018 Discharge date: 09/02/2018  Admitted From: Home Disposition: Home  Recommendations for Outpatient Follow-up:  1. Follow up with PCP in 1-2 weeks 2. Please obtain BMP/CBC in one week your next doctors visit.  3. Take Bactrim double strength orally twice daily for 10 days 4. Once this infection has resolved, would advise outpatient primary care provider to consider getting him a mammogram for an ultrasound to rule out any other underlying pathology to rule out malignancy.   Home Health: None Equipment/Devices: None Discharge Condition: Stable CODE STATUS: Full code Diet recommendation: 2 g salt diet  Brief/Interim Summary: 37 year old with history of Tourette's disease, OCD, tobacco use, right breast cellulitis, asthma came to the hospital with complains of left breast pain.  He states this pain started about 3 weeks ago and has progressively getting worse.  The area is also become more erythematous and swollen.  He was started on Bactrim by his primary care provider without much improvement.  He started noticing skin breakdown and had been applying hydrogen peroxide and warm compresses.  Due to failure of restoration of the symptoms he came to the hospital for further evaluation. Previously he has been evaluated for right breast mass and cellulitis which ended up being noncancerous according to him. CT of the chest showed possible left breast abscess.  General surgery was consulted who recommended conservative management and follow-up outpatient.  At this time we will discharge the patient with outpatient follow-up with primary care provider and also get a referral to the breast center to rule out malignancy.  He may end up requiring further work-up once treatment course for this active infection has been completed.   Discharge  Diagnoses:  Principal Problem:   Abscess of male breast Active Problems:   Asthma   OCD (obsessive compulsive disorder)   Tourette's disease   Cellulitis of left breast   Tobacco abuse   Sepsis (HCC)   HTN (hypertension)  Left breast cellulitis and abscess, currently draining -This is improving with conservative management.  This continues to drain on its own.  Seen by general surgeon. -We will discharge the patient on 10 days of oral Bactrim and have him follow-up outpatient with primary care provider.  At the end of this treatment course he will need outpatient work-up for possible malignancy.  Previously he had similar symptoms of his right breast and had work-up which was negative for malignancy.  Essential hypertension -Resume home medication.  History of asthma, stable - Resume home bronchodilators as needed.  History of OCD and Tourette's disease - Currently this is stable.  He can continue his home Depakote, Prozac and Cogentin  Active tobacco use -Counseled to quit using this.  On SCDs for DVT prophylaxis while he was here Full code Patient's family is at bedside. Discharged today in stable condition.  Discharge Instructions   Allergies as of 09/02/2018   No Known Allergies     Medication List    TAKE these medications   benztropine 1 MG tablet Commonly known as:  COGENTIN Take 1 mg by mouth at bedtime.   divalproex 500 MG 24 hr tablet Commonly known as:  DEPAKOTE ER Take 1,500 mg by mouth at bedtime.   FLUoxetine 20 MG capsule Commonly known as:  PROZAC Take 60 mg by mouth daily.   haloperidol decanoate 100 MG/ML injection Commonly known as:  HALDOL DECANOATE Inject 50  mg into the muscle every 28 (twenty-eight) days.   loratadine 10 MG tablet Commonly known as:  CLARITIN Take 10 mg by mouth at bedtime.   losartan-hydrochlorothiazide 100-25 MG tablet Commonly known as:  HYZAAR Take 1 tablet by mouth daily.   sulfamethoxazole-trimethoprim  800-160 MG tablet Commonly known as:  BACTRIM DS,SEPTRA DS Take 1 tablet by mouth every 12 (twelve) hours for 10 days.   Vitamin D 50 MCG (2000 UT) tablet Take 2,000 Units by mouth daily.      Follow-up Information    [facility]. Schedule an appointment as soon as possible for a visit in 1 week(s).   Specialty:  Family Medicine Contact information: [address] [place] [address] [phone]        [facility]. Schedule an appointment as soon as possible for a visit in 1 week(s).   Specialty:  Oncology Contact information: [address] [id number] mc [place] [address] [phone]         No Known Allergies  You were cared for by a hospitalist during your hospital stay. If you have any questions about your discharge medications or the care you received while you were in the hospital after you are discharged, you can call the unit and asked to speak with the hospitalist on call if the hospitalist that took care of you is not available. Once you are discharged, your primary care physician will handle any further medical issues. Please note that no refills for any discharge medications will be authorized once you are discharged, as it is imperative that you return to your primary care physician (or establish a relationship with a primary care physician if you do not have one) for your aftercare needs so that they can reassess your need for medications and monitor your lab values.  Consultations:  General surgery   Procedures/Studies: Ct Chest W Contrast  Result Date: 09/01/2018 CLINICAL DATA:  Initial evaluation for acute left breast swelling, infection. EXAM: CT CHEST WITH CONTRAST TECHNIQUE: Multidetector CT imaging of the chest was performed during intravenous contrast administration. CONTRAST:  OMNIPAQUE IOHEXOL 300 MG/ML  SOLN COMPARISON:  Prior radiograph from 01/26/2009. FINDINGS:  Cardiovascular: Intrathoracic aorta of normal caliber and appearance without aneurysm or other acute abnormality. Visualized great vessels within normal limits. Heart size normal. No pericardial effusion. Limited assessment of the pulmonary arterial tree grossly unremarkable. Mediastinum/Nodes: Visualized thyroid normal. No enlarged mediastinal or hilar lymph nodes identified. Esophagus within normal limits. Lungs/Pleura: Tracheobronchial tree intact and patent. Lungs well inflated bilaterally. Lungs are clear bilaterally. No focal infiltrates. No pulmonary edema or pleural effusion. No pneumothorax. No worrisome pulmonary nodule or mass. Upper Abdomen: Visualized upper abdomen demonstrates no significant finding. Musculoskeletal: No fracture is seen. No acute osseous abnormality. No discrete lytic or blastic osseous lesions. Thick-walled hypodense collection positioned within the central aspect of the subareolar left breast measures approximately 4.8 x 7.0 x 5.1 cm (series 3, image 52). Surrounding hazy inflammatory stranding within the adjacent fat of the left breast. Overlying skin thickening with slight nipple retraction. Given provided history, findings most concerning for infection with abscess. Few enlarged left axillary lymph nodes measure up to approximately 19 mm, which may be reactive. Enlarged right axillary nodes measure up to 19 mm as well. IMPRESSION: 1. 4.8 x 7.0 x 5.1 cm thick-walled collection within the central aspect of the left breast with surrounding inflammatory changes. Given provided history, findings most suspicious for acute infection with  abscess formation. Clinical follow-up to resolution and correlation with mammography and/or ultrasound recommended, as a possible mass and/or inflammatory breast cancer could also have this appearance. 2. Enlarged axillary adenopathy, suspected to be reactive. Clinical follow-up to resolution recommended. 3. No other acute abnormality within the chest.  Electronically Signed   By: Rise Mu M.D.   On: 09/01/2018 04:07      Subjective: Feeling better, his wound is open and draining without any issues.  No new complaints.  No acute events overnight.  General = no fevers, chills, dizziness, malaise, fatigue HEENT/EYES = negative for pain, redness, loss of vision, double vision, blurred vision, loss of hearing, sore throat, hoarseness, dysphagia Cardiovascular= negative for chest pain, palpitation, murmurs, lower extremity swelling Respiratory/lungs= negative for shortness of breath, cough, hemoptysis, wheezing, mucus production Gastrointestinal= negative for nausea, vomiting,, abdominal pain, melena, hematemesis Genitourinary= negative for Dysuria, Hematuria, Change in Urinary Frequency MSK = Negative for arthralgia, myalgias, Back Pain, Joint swelling  Neurology= Negative for headache, seizures, numbness, tingling  Psychiatry= Negative for anxiety, depression, suicidal and homocidal ideation Allergy/Immunology= Medication/Food allergy as listed  Skin= Negative for Rash, lesions, ulcers, itching    Discharge Exam: Vitals:   09/01/18 2134 09/02/18 0519  BP: 133/72 135/70  Pulse: 91 86  Resp: 16 19  Temp: 98.4 F (36.9 C) 99.5 F (37.5 C)  SpO2: 98% 97%   Vitals:   09/01/18 0624 09/01/18 1349 09/01/18 2134 09/02/18 0519  BP: 135/85 (!) 141/84 133/72 135/70  Pulse: 88 (!) 109 91 86  Resp:  18 16 19   Temp: 97.9 F (36.6 C) 98.6 F (37 C) 98.4 F (36.9 C) 99.5 F (37.5 C)  TempSrc: Oral Oral Oral Oral  SpO2: 97% 96% 98% 97%  Weight:      Height:        General: Pt is alert, awake, not in acute distress Cardiovascular: RRR, S1/S2 +, no rubs, no gallops Respiratory: CTA bilaterally, no wheezing, no rhonchi Abdominal: Soft, NT, ND, bowel sounds + Extremities: no edema, no cyanosis Spontaneous draining from his left breast abscess noted.   The results of significant diagnostics from this hospitalization  (including imaging, microbiology, ancillary and laboratory) are listed below for reference.     Microbiology: Recent Results (from the past 240 hour(s))  Blood culture (routine x 2)     Status: None (Preliminary result)   Collection Time: 09/01/18  1:10 AM  Result Value Ref Range Status   Specimen Description BLOOD LEFT ARM  Final   Special Requests   Final    BOTTLES DRAWN AEROBIC AND ANAEROBIC Blood Culture results may not be optimal due to an excessive volume of blood received in culture bottles   Culture   Final    NO GROWTH 1 DAY Performed at [facility], [address], [place], [place] [address]    Report Status PENDING  Incomplete  Blood culture (routine x 2)     Status: None (Preliminary result)   Collection Time: 09/01/18  1:40 AM  Result Value Ref Range Status   Specimen Description BLOOD RIGHT FOREARM  Final   Special Requests   Final    BOTTLES DRAWN AEROBIC AND ANAEROBIC Blood Culture adequate volume   Culture   Final    NO GROWTH 1 DAY Performed at [facility], [address] [place], [place] [address]    Report Status PENDING  Incomplete  Aerobic Culture (superficial specimen)     Status: None (Preliminary result)  Collection Time: 09/01/18  4:01 PM  Result Value Ref Range Status   Specimen Description WOUND LEFT CHEST  Final   Special Requests NONE  Final   Gram Stain NO WBC SEEN RARE GRAM POSITIVE COCCI   Final   Culture   Final    FEW UNIDENTIFIED ORGANISM Performed at [facility], [address] [place], [place] [address]    Report Status PENDING  Incomplete     Labs: BNP (last 3 results) No results for input(s): BNP in the last 8760 hours. Basic Metabolic Panel: Recent Labs  Lab 09/01/18 0138 09/02/18 0442  NA 135 138  K 3.8 4.1  CL 97* 99  CO2 26 27  GLUCOSE 86 70  BUN 13 7  CREATININE 0.99 0.87  CALCIUM 9.6 8.6*  MG  --  2.1   Liver Function Tests: Recent Labs  Lab 09/01/18 0138 09/02/18 0442  AST 16 19   ALT 18 12  ALKPHOS 54 54  BILITOT 0.3 0.8  PROT 7.6 7.0  ALBUMIN 3.9 3.0*   No results for input(s): LIPASE, AMYLASE in the last 168 hours. No results for input(s): AMMONIA in the last 168 hours. CBC: Recent Labs  Lab 09/01/18 0138 09/02/18 0442  WBC 14.5* 11.8*  NEUTROABS 9.2*  --   HGB 12.8* 12.2*  HCT 40.2 38.4*  MCV 90.5 88.7  PLT 385 367   Cardiac Enzymes: No results for input(s): CKTOTAL, CKMB, CKMBINDEX, TROPONINI in the last 168 hours. BNP: Invalid input(s): POCBNP CBG: No results for input(s): GLUCAP in the last 168 hours. D-Dimer No results for input(s): DDIMER in the last 72 hours. Hgb A1c No results for input(s): HGBA1C in the last 72 hours. Lipid Profile No results for input(s): CHOL, HDL, LDLCALC, TRIG, CHOLHDL, LDLDIRECT in the last 72 hours. Thyroid function studies No results for input(s): TSH, T4TOTAL, T3FREE, THYROIDAB in the last 72 hours.  Invalid input(s): FREET3 Anemia work up No results for input(s): VITAMINB12, FOLATE, FERRITIN, TIBC, IRON, RETICCTPCT in the last 72 hours. Urinalysis No results found for: COLORURINE, APPEARANCEUR, LABSPEC, PHURINE, GLUCOSEU, HGBUR, BILIRUBINUR, KETONESUR, PROTEINUR, UROBILINOGEN, NITRITE, LEUKOCYTESUR Sepsis Labs Invalid input(s): PROCALCITONIN,  WBC,  LACTICIDVEN Microbiology Recent Results (from the past 240 hour(s))  Blood culture (routine x 2)     Status: None (Preliminary result)   Collection Time: 09/01/18  1:10 AM  Result Value Ref Range Status   Specimen Description BLOOD LEFT ARM  Final   Special Requests   Final    BOTTLES DRAWN AEROBIC AND ANAEROBIC Blood Culture results may not be optimal due to an excessive volume of blood received in culture bottles   Culture   Final    NO GROWTH 1 DAY Performed at [facility], [address] [place], [place] [address]    Report Status PENDING  Incomplete  Blood culture (routine x 2)     Status: None (Preliminary result)   Collection Time:  09/01/18  1:40 AM  Result Value Ref Range Status   Specimen Description BLOOD RIGHT FOREARM  Final   Special Requests   Final    BOTTLES DRAWN AEROBIC AND ANAEROBIC Blood Culture adequate volume   Culture   Final    NO GROWTH 1 DAY Performed at [facility], [address] [place], [place] [address]    Report Status PENDING  Incomplete  Aerobic Culture (superficial specimen)     Status: None (Preliminary result)   Collection Time: 09/01/18  4:01 PM  Result Value Ref Range Status   Specimen Description WOUND LEFT CHEST  Final   Special Requests NONE  Final   Gram Stain NO WBC SEEN RARE GRAM POSITIVE COCCI   Final   Culture   Final    FEW UNIDENTIFIED ORGANISM Performed at [facility], [address] [place], [place] [address]    Report Status PENDING  Incomplete     Time coordinating discharge:  I have spent 35 minutes face to face with the patient and on the ward discussing the patients care, assessment, plan and disposition with other care givers. >50% of the time was devoted counseling the patient about the risks and benefits of treatment/Discharge disposition and coordinating care.   SIGNED:   Dimple Nanas, MD  [facility] 09/02/2018, 11:26 AM Pager   If 7PM-7AM, please contact night-coverage [email or web address] Password [email or web address]

## 2018-09-02 NOTE — Plan of Care (Signed)

## 2018-09-02 NOTE — Progress Notes (Signed)
Patient ID: Bryan Cummings, male   DOB: July 20, 1981, 37 y.o.   MRN: [id number] [facility] Surgery Progress Note:   * No surgery found *  Subjective: Mental status is unchanged.   Objective: Vital signs in last 24 hours: Temp:  [98.4 F (36.9 C)-99.5 F (37.5 C)] 99.5 F (37.5 C) (12/01 0519) Pulse Rate:  [86-109] 86 (12/01 0519) Resp:  [16-19] 19 (12/01 0519) BP: (133-141)/(70-84) 135/70 (12/01 0519) SpO2:  [96 %-98 %] 97 % (12/01 0519)  Intake/Output from previous day: 11/30 0701 - 12/01 0700 In: 1934.8 [P.O.:360; I.V.:783.6; IV Piggyback:791.2] Out: -  Intake/Output this shift: No intake/output data recorded.  Physical Exam: Work of breathing is OK.  Spontaneous drainage of the left breast abscess.    Lab Results:  Results for orders placed or performed during the hospital encounter of 09/01/18 (from the past 48 hour(s))  Blood culture (routine x 2)     Status: None (Preliminary result)   Collection Time: 09/01/18  1:10 AM  Result Value Ref Range   Specimen Description BLOOD LEFT ARM    Special Requests      BOTTLES DRAWN AEROBIC AND ANAEROBIC Blood Culture results may not be optimal due to an excessive volume of blood received in culture bottles   Culture      NO GROWTH < 12 HOURS Performed at [facility], [address], [place], [place] [address]    Report Status PENDING   CBC with Differential     Status: Abnormal   Collection Time: 09/01/18  1:38 AM  Result Value Ref Range   WBC 14.5 (H) 4.0 - 10.5 K/uL   RBC 4.44 4.22 - 5.81 MIL/uL   Hemoglobin 12.8 (L) 13.0 - 17.0 g/dL   HCT 19.1 47.8 - 29.5 %   MCV 90.5 80.0 - 100.0 fL   MCH 28.8 26.0 - 34.0 pg   MCHC 31.8 30.0 - 36.0 g/dL   RDW 62.1 (H) 30.8 - 65.7 %   Platelets 385 150 - 400 K/uL   nRBC 0.0 0.0 - 0.2 %   Neutrophils Relative % 63 %   Neutro Abs 9.2 (H) 1.7 - 7.7 K/uL   Lymphocytes Relative 19 %   Lymphs Abs 2.8 0.7 - 4.0 K/uL   Monocytes Relative 14 %   Monocytes Absolute 2.0 (H) 0.1 - 1.0  K/uL   Eosinophils Relative 2 %   Eosinophils Absolute 0.3 0.0 - 0.5 K/uL   Basophils Relative 0 %   Basophils Absolute 0.0 0.0 - 0.1 K/uL   Immature Granulocytes 2 %   Abs Immature Granulocytes 0.25 (H) 0.00 - 0.07 K/uL    Comment: Performed at [facility], [address], [place], [place] [address]  Comprehensive metabolic panel     Status: Abnormal   Collection Time: 09/01/18  1:38 AM  Result Value Ref Range   Sodium 135 135 - 145 mmol/L   Potassium 3.8 3.5 - 5.1 mmol/L   Chloride 97 (L) 98 - 111 mmol/L   CO2 26 22 - 32 mmol/L   Glucose, Bld 86 70 - 99 mg/dL   BUN 13 6 - 20 mg/dL   Creatinine, Ser 2.95 0.61 - 1.24 mg/dL   Calcium 9.6 8.9 - 28.4 mg/dL   Total Protein 7.6 6.5 - 8.1 g/dL   Albumin 3.9 3.5 - 5.0 g/dL   AST 16 15 - 41 U/L   ALT 18 0 - 44 U/L   Alkaline Phosphatase 54 38 - 126  U/L   Total Bilirubin 0.3 0.3 - 1.2 mg/dL   GFR calc non Af Amer >60 >60 mL/min   GFR calc Af Amer >60 >60 mL/min   Anion gap 12 5 - 15    Comment: Performed at [facility], [address], [place], [place] [address]  I-Stat CG4 Lactic Acid, ED     Status: None   Collection Time: 09/01/18  1:42 AM  Result Value Ref Range   Lactic Acid, Venous 1.65 0.5 - 1.9 mmol/L  Lactic acid, plasma     Status: None   Collection Time: 09/01/18  5:02 AM  Result Value Ref Range   Lactic Acid, Venous 0.7 0.5 - 1.9 mmol/L    Comment: Performed at [facility], [address], [place], [place] [address]  Procalcitonin     Status: None   Collection Time: 09/01/18  5:02 AM  Result Value Ref Range   Procalcitonin <0.10 ng/mL    Comment:        Interpretation: PCT (Procalcitonin) <= 0.5 ng/mL: Systemic infection (sepsis) is not likely. Local bacterial infection is possible. (NOTE)       Sepsis PCT Algorithm           Lower Respiratory Tract                                      Infection PCT Algorithm    ----------------------------     ----------------------------         PCT < 0.25  ng/mL                PCT < 0.10 ng/mL         Strongly encourage             Strongly discourage   discontinuation of antibiotics    initiation of antibiotics    ----------------------------     -----------------------------       PCT 0.25 - 0.50 ng/mL            PCT 0.10 - 0.25 ng/mL               OR       >80% decrease in PCT            Discourage initiation of                                            antibiotics      Encourage discontinuation           of antibiotics    ----------------------------     -----------------------------         PCT >= 0.50 ng/mL              PCT 0.26 - 0.50 ng/mL               AND        <80% decrease in PCT             Encourage initiation of                                             antibiotics  Encourage continuation           of antibiotics    ----------------------------     -----------------------------        PCT >= 0.50 ng/mL                  PCT > 0.50 ng/mL               AND         increase in PCT                  Strongly encourage                                      initiation of antibiotics    Strongly encourage escalation           of antibiotics                                     -----------------------------                                           PCT <= 0.25 ng/mL                                                 OR                                        > 80% decrease in PCT                                     Discontinue / Do not initiate                                             antibiotics Performed at [facility], [address] [place], [place] [address]   Protime-INR     Status: None   Collection Time: 09/01/18  5:08 AM  Result Value Ref Range   Prothrombin Time 12.4 11.4 - 15.2 seconds   INR 0.93     Comment: Performed at [facility], [address] [place], [place] [address]  APTT     Status: None   Collection Time: 09/01/18  5:08 AM  Result Value Ref Range   aPTT 32 24 - 36 seconds     Comment: Performed at [facility], [address] [place], [place] [address]  Aerobic Culture (superficial specimen)     Status: None (Preliminary result)   Collection Time: 09/01/18  4:01 PM  Result Value Ref Range   Specimen Description WOUND LEFT CHEST    Special Requests NONE    Gram Stain      NO WBC SEEN RARE GRAM POSITIVE COCCI Performed at [facility], [address] [place],  [place] [address]    Culture PENDING    Report Status PENDING   CBC     Status: Abnormal   Collection Time: 09/02/18  4:42 AM  Result Value Ref Range   WBC 11.8 (H) 4.0 - 10.5 K/uL   RBC 4.33 4.22 - 5.81 MIL/uL   Hemoglobin 12.2 (L) 13.0 - 17.0 g/dL   HCT 60.4 (L) 54.0 - 98.1 %   MCV 88.7 80.0 - 100.0 fL   MCH 28.2 26.0 - 34.0 pg   MCHC 31.8 30.0 - 36.0 g/dL   RDW 19.1 47.8 - 29.5 %   Platelets 367 150 - 400 K/uL   nRBC 0.0 0.0 - 0.2 %    Comment: Performed at [facility], [address] [place], [place] [address]  Comprehensive metabolic panel     Status: Abnormal   Collection Time: 09/02/18  4:42 AM  Result Value Ref Range   Sodium 138 135 - 145 mmol/L   Potassium 4.1 3.5 - 5.1 mmol/L   Chloride 99 98 - 111 mmol/L   CO2 27 22 - 32 mmol/L   Glucose, Bld 70 70 - 99 mg/dL   BUN 7 6 - 20 mg/dL   Creatinine, Ser 8.65 0.61 - 1.24 mg/dL   Calcium 8.6 (L) 8.9 - 10.3 mg/dL   Total Protein 7.0 6.5 - 8.1 g/dL   Albumin 3.0 (L) 3.5 - 5.0 g/dL   AST 19 15 - 41 U/L   ALT 12 0 - 44 U/L   Alkaline Phosphatase 54 38 - 126 U/L   Total Bilirubin 0.8 0.3 - 1.2 mg/dL   GFR calc non Af Amer >60 >60 mL/min   GFR calc Af Amer >60 >60 mL/min   Anion gap 12 5 - 15    Comment: Performed at [facility], [address], [place], [place] [address]  Magnesium     Status: None   Collection Time: 09/02/18  4:42 AM  Result Value Ref Range   Magnesium 2.1 1.7 - 2.4 mg/dL    Comment: Performed at [facility], [address], [place], [place] [address]    Radiology/Results: Ct  Chest W Contrast  Result Date: 09/01/2018 CLINICAL DATA:  Initial evaluation for acute left breast swelling, infection. EXAM: CT CHEST WITH CONTRAST TECHNIQUE: Multidetector CT imaging of the chest was performed during intravenous contrast administration. CONTRAST:  OMNIPAQUE IOHEXOL 300 MG/ML  SOLN COMPARISON:  Prior radiograph from 01/26/2009. FINDINGS: Cardiovascular: Intrathoracic aorta of normal caliber and appearance without aneurysm or other acute abnormality. Visualized great vessels within normal limits. Heart size normal. No pericardial effusion. Limited assessment of the pulmonary arterial tree grossly unremarkable. Mediastinum/Nodes: Visualized thyroid normal. No enlarged mediastinal or hilar lymph nodes identified. Esophagus within normal limits. Lungs/Pleura: Tracheobronchial tree intact and patent. Lungs well inflated bilaterally. Lungs are clear bilaterally. No focal infiltrates. No pulmonary edema or pleural effusion. No pneumothorax. No worrisome pulmonary nodule or mass. Upper Abdomen: Visualized upper abdomen demonstrates no significant finding. Musculoskeletal: No fracture is seen. No acute osseous abnormality. No discrete lytic or blastic osseous lesions. Thick-walled hypodense collection positioned within the central aspect of the subareolar left breast measures approximately 4.8 x 7.0 x 5.1 cm (series 3, image 52). Surrounding hazy inflammatory stranding within the adjacent fat of the left breast. Overlying skin thickening with slight nipple retraction. Given provided history, findings most concerning for infection with abscess. Few enlarged left axillary lymph nodes measure up to approximately 19 mm, which may  be reactive. Enlarged right axillary nodes measure up to 19 mm as well. IMPRESSION: 1. 4.8 x 7.0 x 5.1 cm thick-walled collection within the central aspect of the left breast with surrounding inflammatory changes. Given provided history, findings most suspicious for acute  infection with abscess formation. Clinical follow-up to resolution and correlation with mammography and/or ultrasound recommended, as a possible mass and/or inflammatory breast cancer could also have this appearance. 2. Enlarged axillary adenopathy, suspected to be reactive. Clinical follow-up to resolution recommended. 3. No other acute abnormality within the chest. Electronically Signed   By: Rise Mu M.D.   On: 09/01/2018 04:07    Anti-infectives: Anti-infectives (From admission, onward)   Start     Dose/Rate Route Frequency Ordered Stop   09/02/18 0200  cefTRIAXone (ROCEPHIN) 2 g in sodium chloride 0.9 % 100 mL IVPB     2 g 200 mL/hr over 30 Minutes Intravenous Every 24 hours 09/01/18 0457     09/01/18 1500  vancomycin (VANCOCIN) 1,750 mg in sodium chloride 0.9 % 500 mL IVPB     1,750 mg 250 mL/hr over 120 Minutes Intravenous Every 12 hours 09/01/18 1313     09/01/18 0145  cefTRIAXone (ROCEPHIN) 2 g in sodium chloride 0.9 % 100 mL IVPB     2 g 200 mL/hr over 30 Minutes Intravenous  Once 09/01/18 0136 09/01/18 0237   09/01/18 0145  vancomycin (VANCOCIN) 2,500 mg in sodium chloride 0.9 % 500 mL IVPB     2,500 mg 250 mL/hr over 120 Minutes Intravenous  Once 09/01/18 0140 09/01/18 0437      Assessment/Plan: Problem List: Patient Active Problem List   Diagnosis Date Noted  . Abscess of male breast 09/01/2018  . Cellulitis of left breast 09/01/2018  . Sepsis (HCC) 09/01/2018  . HTN (hypertension) 09/01/2018  . Asthma   . OCD (obsessive compulsive disorder)   . Tourette's disease   . Tobacco abuse     May be discharged at discretion of medicine with PO antibiotics and with followup in the breast center.   * No surgery found *    LOS: 1 day   Matt B. Daphine Deutscher, MD, [facility] [phone] beeper [phone]  09/02/2018 9:26 AM

## 2018-09-02 NOTE — Consult Note (Signed)
WOC Nurse wound consult note Reason for Consult: Left breast mass. WOC Nursing is simultaneously consulted with Surgery ([facility]); both Drs. White and Daphine DeutscherMartin have seen patient and a diagnostic procedure is planned for Monday in IR.  WOC nursing team will not follow, but will remain available to this patient, the nursing, surgical and medical teams if needed.   Thanks, Ladona MowLaurie Tniyah Nakagawa, MSN, RN, GNP, Hans EdenCWOCN, CWON-AP, FAAN  Pager# [phone]

## 2018-09-03 LAB — AEROBIC CULTURE W GRAM STAIN (SUPERFICIAL SPECIMEN): Gram Stain: NONE SEEN

## 2018-09-03 LAB — AEROBIC CULTURE  (SUPERFICIAL SPECIMEN)

## 2018-09-03 NOTE — Plan of Care (Signed)
Notified by Pharmacy that patients cultures came back resistant to Bactrim therefore changed prescription to Doxycycline 100mg  po bid for 10 days.  Called his pharmacy [facility] with the prescription. Also notified the patient's grandmother/Caretaker who will pick it up later today.   Stephania FragminAnkit Catlin Aycock MD

## 2018-09-06 LAB — CULTURE, BLOOD (ROUTINE X 2)
Culture: NO GROWTH
Culture: NO GROWTH
Special Requests: ADEQUATE

## 2018-09-10 LAB — HIV ANTIBODY (ROUTINE TESTING W REFLEX): HIV Screen 4th Generation wRfx: NONREACTIVE

## 2018-09-13 ENCOUNTER — Other Ambulatory Visit: Payer: Self-pay | Admitting: Student

## 2018-09-13 DIAGNOSIS — N6320 Unspecified lump in the left breast, unspecified quadrant: Secondary | ICD-10-CM

## 2018-09-14 ENCOUNTER — Ambulatory Visit
Admission: RE | Admit: 2018-09-14 | Discharge: 2018-09-14 | Disposition: A | Discharge status: Home / Self Care | Payer: Medicare Other | Source: Ambulatory Visit | Attending: Student | Admitting: Student

## 2018-09-14 ENCOUNTER — Other Ambulatory Visit: Payer: Self-pay | Admitting: Student

## 2018-09-14 DIAGNOSIS — N6320 Unspecified lump in the left breast, unspecified quadrant: Secondary | ICD-10-CM

## 2018-09-19 LAB — AEROBIC/ANAEROBIC CULTURE W GRAM STAIN (SURGICAL/DEEP WOUND)
Culture: NO GROWTH
Special Requests: NORMAL

## 2018-09-28 ENCOUNTER — Ambulatory Visit
Admission: RE | Admit: 2018-09-28 | Discharge: 2018-09-28 | Disposition: A | Discharge status: Home / Self Care | Payer: Medicare Other | Source: Ambulatory Visit | Attending: Student | Admitting: Student

## 2018-09-28 DIAGNOSIS — N6320 Unspecified lump in the left breast, unspecified quadrant: Secondary | ICD-10-CM

## 2018-10-23 ENCOUNTER — Encounter (HOSPITAL_COMMUNITY): Payer: Self-pay | Admitting: *Deleted

## 2018-10-23 ENCOUNTER — Other Ambulatory Visit: Payer: Self-pay | Admitting: General Surgery

## 2018-10-23 ENCOUNTER — Other Ambulatory Visit: Payer: Self-pay

## 2018-10-23 NOTE — Progress Notes (Signed)
Called Legal Kary KosGuardian, Joseph Suggs at [phone] and LM on VM to call back to get information about patient's Guardianship paperwork from him and to give him instructions for the day of patient's surgery.

## 2018-10-23 NOTE — Progress Notes (Signed)
NO SOLID FOOD AFTER MIDNIGHT THE NIGHT PRIOR TO SURGERY. NOTHING BY MOUTH EXCEPT CLEAR LIQUIDS UNTIL 3 HOURS PRIOR TO SCHEULED SURGERY. PLEASE FINISH ENSURE DRINK PER SURGEON ORDER 3 HOURS PRIOR TO SCHEDULED SURGERY TIME WHICH NEEDS TO BE COMPLETED AT   0630 am.   CLEAR LIQUID DIET   Foods Allowed                                                                     Foods Excluded  Coffee and tea, regular and decaf                             liquids that you cannot  Plain Jell-O in any flavor                                             see through such as: Fruit ices (not with fruit pulp)                                     milk, soups, orange juice  Iced Popsicles                                    All solid food Carbonated beverages, regular and diet                                    Cranberry, grape and apple juices Sports drinks like Gatorade Lightly seasoned clear broth or consume(fat free) Sugar, honey syrup  Sample Menu Breakfast                                Lunch                                     Supper Cranberry juice                    Beef broth                            Chicken broth Jell-O                                     Grape juice                           Apple juice Coffee or tea                        Jell-O  Popsicle                                                Coffee or tea                        Coffee or tea  _____________________________________________________________________  Reviewed instructions with grandmother, Rhythm Alatorre via phone with her verbalizing understanding.

## 2018-10-29 NOTE — H&P (Signed)
LENNYN Cummings Documented: 10/23/2018 10:42 AM Location: [facility] Patient #: [id number] DOB: May 26, 1981 Single / Language: [place] / Race: Black or African American Male   History of Present Illness Almond Lint MD; 10/23/2018 11:26 AM) The patient is a 38 year old male who presents with a subcutaneous abscess. The patient is a very pleasant 38 year old male, with PMHx of OCD, Tourettes, tobacco use and cognitive impairment who was initially evaluated by Dr. Donell Beers in August 2018 with the following HPI: "Presents with a diagnosis of a chronic right breast abscess. Patient first had this problem in late 2017. An ultrasound and mammogram was performed. There was a irregular region there and a mass. A short interval follow-up was performed and biopsy was recommended. He did undergo an ultrasound-guided biopsy in February and this was benign. He had an additional follow-up ultrasound last week. There was a 2.3 cm irregular abscess visible there. He has had drainage of this 2-3 times. He states that when it does occur, that it is not severe and he has not required surgical drainage or packing. He denies fever."  Dr. Donell Beers offered excision, but the patient preferred to wait and watch for recurrent symptoms.  However, on 09/01/18, he presented to the ER with LEFT breast cellulitis  1 month. He had been taking Bactrim for 2 weeks prior to the ER visit. There is a picture placed in his Epic chart from the ER visit, which shows scaly erythematous skin across his breast.   He had several procedures at the breast center after that including aspiration attempts and additional antibiotics. However, the left side has continued to worsen and the right side has had issues as well. He has run out of doxycycline and has had issues getting it  follow up imaging 09/28/2018 CLINICAL DATA: 38 year old male presenting for follow-up a left breast abscess. The patient was last seen on  09/14/2018. Since then he has been on a new course of antibiotics with some improvement. The patient's grandmother said that the drainage he had been experiencing esternally stopped about 1 week ago.  EXAM: ULTRASOUND OF THE LEFT BREAST  COMPARISON: Previous exam(s).  FINDINGS: On physical exam, there is persistent swelling and dark in discoloration around the periareolar and superior left breast. The tissue in this area is firm.  Ultrasound of the retroareolar left breast demonstrates a multiloculated irregular mass in the retroareolar left breast. Overall, this appears mildly improved from the ultrasound performed 09/11/2018 [facility]. The bulk of the masses in the retroareolar location and today measures approximately 4.8 x 2.9 x 3.7 cm. There is only a small amount of definitely drainable fluid identified. Representative images of the multiloculated fluid pockets were acquired in the retroareolar left breast, as well as 5, 6 and 11 o'clock.  IMPRESSION: Persistent multiloculated abscess with mild improvement in the retroareolar left breast.  RECOMMENDATION: Given the multiloculated appearance, and that the patient has been treated with antibiotics for a month with only mild improvement, surgical consultation is recommended. If surgery desires additional aspiration attempts or biopsies, we can assist as needed. We are in the process of assisting in setting up the surgical appointment for him.  I have discussed the findings and recommendations with the patient. Results were also provided in writing at the conclusion of the visit. If applicable, a reminder letter will be sent to the patient regarding the next appointment.  BI-RADS CATEGORY 2: Benign.    CT scan on 09/01/18 revealed: IMPRESSION: 1. 4.8 x 7.0 x 5.1  cm thick-walled collection within the central aspect of the left breast with surrounding inflammatory changes. Given provided history, findings most  suspicious for acute infection with abscess formation. Clinical follow-up to resolution and correlation with mammography and/or ultrasound recommended, as a possible mass and/or inflammatory breast cancer could also have this appearance. 2. Enlarged axillary adenopathy, suspected to be reactive. Clinical follow-up to resolution recommended. 3. No other acute abnormality within the chest.  Given his tachycardia labs, and imaging, [facility] was consulted. IR was contacted for possible aspiration but they wanted evaluation at the breast center first. During his hospital stay, he states that the area spontaneously drained a large amount of purulent material. He was subsequently discharged on 09/02/18. It stopped draining about 2 days later.  He underwent mammogram of left breast on 09/11/18 at [facility] which showed a 4 x 6 cm density in the left retroareolar breast, likely abscess. No other masses or calcifications. Ultrasound was then completed which showed an irregular fluid collection behind the nipple along with 2 smaller fluid collections in the breast tissue. He was sent to [facility] for possible percutaneous drainage.  He states that since it spontaneously drained in the hospital, the swelling and pain have decreased. The swelling is still present though. He finished his 10 day course of doxycycline today. He denies fever/chills. States it has not drained recently.    Allergies (Tanisha A. Manson Passey, RMA; 10/23/2018 10:43 AM) No Known Drug Allergies [05/15/2017]: Allergies Reconciled   Medication History (Tanisha A. Manson Passey, RMA; 10/23/2018 10:43 AM) Losartan Potassium (100MG  Tablet, Oral) Active. FLUoxetine HCl (20MG  Capsule, Oral) Active. Divalproex Sodium ER (500MG  Tablet ER 24HR, Oral) Active. Benztropine Mesylate (1MG  Tablet, Oral) Active. Depakote ER (250MG  Tablet ER 24HR, Oral) Active. FLUoxetine HCl (40MG  Capsule, Oral) Active. Claritin (10MG  Capsule, Oral)  Active. Lisinopril (20MG  Tablet, Oral) Active. Doxycycline Hyclate (100MG  Capsule, Oral) Active. Haloperidol Decanoate (100MG /ML Solution, Intramuscular) Active. D-5000 (5000UNIT Tablet, Oral) Active. hydroCHLOROthiazide (25MG  Tablet, Oral) Active. Haloperidol Decanoate (50MG /ML Solution, Intramuscular) Active. Medications Reconciled    Review of Systems Almond Lint MD; 10/23/2018 11:26 AM) All other systems negative  Vitals (Tanisha A. Brown RMA; 10/23/2018 10:42 AM) 10/23/2018 10:42 AM Weight: 370.8 lb Height: 72in Body Surface Area: 2.77 m Body Mass Index: 50.29 kg/m  Temp.: 98.74F  Pulse: 102 (Regular)  BP: 128/82 (Sitting, Left Arm, Standard)       Physical Exam Almond Lint MD; 10/23/2018 11:27 AM) General Mental Status-Alert. General Appearance-Consistent with stated age. Hydration-Well hydrated. Voice-Normal.  Head and Neck Head-normocephalic, atraumatic with no lesions or palpable masses.  Eye Sclera/Conjunctiva - Bilateral-No scleral icterus.  Chest and Lung Exam Chest and lung exam reveals -quiet, even and easy respiratory effort with no use of accessory muscles. Inspection Chest Wall - Normal. Back - normal.  Breast Note: thickened skin and punctate area of drainage over left nipple. tender. indurated. area is around 8-10 cm of induration. Left breast is much larger than right. Right breast with some induration near nipple, but minimal without drainage.   Cardiovascular Cardiovascular examination reveals -normal pedal pulses bilaterally. Note: regular rate and rhythm  Abdomen Inspection-Inspection Normal. Palpation/Percussion Palpation and Percussion of the abdomen reveal - Soft, Non Tender, No Rebound tenderness, No Rigidity (guarding) and No hepatosplenomegaly.  Peripheral Vascular Upper Extremity Inspection - Bilateral - Normal - No Clubbing, No Cyanosis, No Edema, Pulses Intact. Lower  Extremity Palpation - Edema - Bilateral - No edema.  Neurologic Neurologic evaluation reveals -alert and oriented x 3 with no impairment of recent  or remote memory. Mental Status-Normal.  Musculoskeletal Global Assessment -Note: no gross deformities.  Normal Exam - Left-Upper Extremity Strength Normal and Lower Extremity Strength Normal. Normal Exam - Right-Upper Extremity Strength Normal and Lower Extremity Strength Normal.  Lymphatic Head & Neck  General Head & Neck Lymphatics: Bilateral - Description - Normal. Axillary  General Axillary Region: Bilateral - Description - Normal. Tenderness - Non Tender.    Assessment & Plan Almond Lint(Anthonyjames Bargar MD; 10/23/2018 11:29 AM) LEFT BREAST ABSCESS (N61.1) Impression: I recommend excision of this chronically inflamed area on the left with the loculated abscesses. Aspiration has been attempted multiple times, and antibiotics have not resolved it. I discussed that I would take a crescent shape of tissue above the nipple and clean out the abscesses below. i recommend being asleep for this in order to maximize the drainage.  Patient's social work guardian is present as well as grandmother. Will get this set up next week. Current Plans Schedule for Surgery Pt Education - Skin or Soft Tissue Abscess: skin abscess Restarted Doxycycline Hyclate 100 MG Oral Tablet, 1 (one) Tablet two times daily, #42, 21 days starting 10/23/2018, No Refill.

## 2018-10-29 NOTE — Progress Notes (Signed)
Placed call on 10/25/2018 to Mirna Mires in regards to phone consent x 2 RNs for patient surgery from Supervisor.   On 10/29/2018 placed call and LVMM to obtain phone consent from Supervisor for patient surgery on 10/31/2018.   On 10/26/2018- grandmother picked up hibiclens and instructions- She is the caregiver for patient

## 2018-10-30 MED ORDER — DEXTROSE 5 % IV SOLN
3.0000 g | INTRAVENOUS | Status: DC
  Filled 2018-10-30: qty 3000

## 2018-10-31 ENCOUNTER — Encounter (HOSPITAL_COMMUNITY): Payer: Self-pay | Admitting: Anesthesiology

## 2018-10-31 ENCOUNTER — Ambulatory Visit (HOSPITAL_COMMUNITY): Admission: RE | Admit: 2018-10-31 | Payer: Medicare Other | Source: Home / Self Care | Admitting: General Surgery

## 2018-10-31 HISTORY — DX: Essential (primary) hypertension: I10

## 2018-10-31 HISTORY — DX: Other complications of anesthesia, initial encounter: T88.59XA

## 2018-10-31 HISTORY — DX: Family history of other specified conditions: Z84.89

## 2018-10-31 HISTORY — DX: Major depressive disorder, single episode, unspecified: F32.9

## 2018-10-31 HISTORY — DX: Depression, unspecified: F32.A

## 2018-10-31 HISTORY — DX: Adverse effect of unspecified anesthetic, initial encounter: T41.45XA

## 2018-10-31 SURGERY — INCISION AND DRAINAGE, ABSCESS
Anesthesia: General | Laterality: Left

## 2018-10-31 NOTE — Anesthesia Preprocedure Evaluation (Deleted)
Anesthesia Evaluation    Reviewed: Allergy & Precautions, Patient's Chart, lab work & pertinent test results  History of Anesthesia Complications Negative for: history of anesthetic complications  Airway        Dental   Pulmonary asthma , Current Smoker,           Cardiovascular hypertension, Pt. on medications      Neuro/Psych PSYCHIATRIC DISORDERS Anxiety Depression  OCD Tourette'snegative neurological ROS     GI/Hepatic negative GI ROS, (+)     substance abuse  marijuana use,   Endo/Other  negative endocrine ROS  Renal/GU negative Renal ROS     Musculoskeletal negative musculoskeletal ROS (+)   Abdominal   Peds  Hematology negative hematology ROS (+)   Anesthesia Other Findings   Reproductive/Obstetrics                             Anesthesia Physical Anesthesia Plan  ASA: II  Anesthesia Plan: General   Post-op Pain Management:    Induction: Intravenous  PONV Risk Score and Plan: 3 and Treatment may vary due to age or medical condition, Ondansetron, Midazolam and Dexamethasone  Airway Management Planned: LMA  Additional Equipment: None  Intra-op Plan:   Post-operative Plan: Extubation in OR  Informed Consent:   Plan Discussed with: CRNA and Anesthesiologist  Anesthesia Plan Comments:         Anesthesia Quick Evaluation

## 2019-05-03 ENCOUNTER — Ambulatory Visit: Payer: Medicare Other | Admitting: Podiatry

## 2019-12-27 ENCOUNTER — Ambulatory Visit: Payer: Medicare HMO | Attending: Internal Medicine

## 2019-12-27 DIAGNOSIS — Z23 Encounter for immunization: Secondary | ICD-10-CM

## 2019-12-27 NOTE — Progress Notes (Signed)
   Covid-19 Vaccination Clinic  Name:  Bryan Cummings    MRN: [id number] DOB: 1980/11/15  12/27/2019  [name] was observed post Covid-19 immunization for 15 minutes without incident. He was provided with Vaccine Information Sheet and instruction to access the V-Safe system.   [name] was instructed to call 911 with any severe reactions post vaccine: Marland Kitchen Difficulty breathing  . Swelling of face and throat  . A fast heartbeat  . A bad rash all over body  . Dizziness and weakness   Immunizations Administered    Name Date Dose VIS Date Route   Pfizer COVID-19 Vaccine 12/27/2019 11:01 AM 0.3 mL 09/13/2019 Intramuscular   Manufacturer: [employer], [employer]   Lot: [id number]   NDC: [address]-4

## 2020-01-20 ENCOUNTER — Ambulatory Visit: Payer: Medicare HMO | Attending: Internal Medicine

## 2020-01-20 DIAGNOSIS — Z23 Encounter for immunization: Secondary | ICD-10-CM

## 2020-01-20 NOTE — Progress Notes (Signed)
   Covid-19 Vaccination Clinic  Name:  NIVAAN DICENZO    MRN: [id number] DOB: 1980/11/10  01/20/2020  Mr. Gildersleeve was observed post Covid-19 immunization for 15 minutes without incident. He was provided with Vaccine Information Sheet and instruction to access the V-Safe system.   Mr. Zaldivar was instructed to call 911 with any severe reactions post vaccine: Marland Kitchen Difficulty breathing  . Swelling of face and throat  . A fast heartbeat  . A bad rash all over body  . Dizziness and weakness   Immunizations Administered    Name Date Dose VIS Date Route   Pfizer COVID-19 Vaccine 01/20/2020  3:54 PM 0.3 mL 11/27/2018 Intramuscular   Manufacturer: [employer], [employer]   Lot: [id number]   NDC: [address]-8

## 2020-04-02 ENCOUNTER — Ambulatory Visit: Payer: Medicare HMO | Admitting: Podiatry

## 2020-04-02 ENCOUNTER — Ambulatory Visit: Payer: Medicare Other | Admitting: Podiatry

## 2020-06-22 ENCOUNTER — Ambulatory Visit: Payer: Medicare HMO | Admitting: Podiatry

## 2020-08-12 ENCOUNTER — Ambulatory Visit: Payer: Medicare HMO | Admitting: Podiatry

## 2020-11-03 ENCOUNTER — Encounter (HOSPITAL_COMMUNITY): Payer: Self-pay

## 2020-11-03 ENCOUNTER — Emergency Department (HOSPITAL_COMMUNITY)
Admission: EM | Admit: 2020-11-03 | Discharge: 2020-11-04 | Disposition: A | Discharge status: Home / Self Care | Payer: Medicare HMO | Attending: Emergency Medicine | Admitting: Emergency Medicine

## 2020-11-03 ENCOUNTER — Other Ambulatory Visit: Payer: Self-pay

## 2020-11-03 DIAGNOSIS — Z638 Other specified problems related to primary support group: Secondary | ICD-10-CM | POA: Diagnosis not present

## 2020-11-03 DIAGNOSIS — F919 Conduct disorder, unspecified: Secondary | ICD-10-CM | POA: Insufficient documentation

## 2020-11-03 DIAGNOSIS — Z79899 Other long term (current) drug therapy: Secondary | ICD-10-CM | POA: Insufficient documentation

## 2020-11-03 DIAGNOSIS — R45851 Suicidal ideations: Secondary | ICD-10-CM | POA: Insufficient documentation

## 2020-11-03 DIAGNOSIS — R000 Tachycardia, unspecified: Secondary | ICD-10-CM | POA: Diagnosis not present

## 2020-11-03 DIAGNOSIS — F429 Obsessive-compulsive disorder, unspecified: Secondary | ICD-10-CM | POA: Diagnosis present

## 2020-11-03 DIAGNOSIS — R9431 Abnormal electrocardiogram [ECG] [EKG]: Secondary | ICD-10-CM | POA: Diagnosis not present

## 2020-11-03 DIAGNOSIS — R69 Illness, unspecified: Secondary | ICD-10-CM | POA: Diagnosis not present

## 2020-11-03 DIAGNOSIS — J45909 Unspecified asthma, uncomplicated: Secondary | ICD-10-CM | POA: Diagnosis not present

## 2020-11-03 DIAGNOSIS — F17210 Nicotine dependence, cigarettes, uncomplicated: Secondary | ICD-10-CM | POA: Diagnosis not present

## 2020-11-03 DIAGNOSIS — R451 Restlessness and agitation: Secondary | ICD-10-CM | POA: Diagnosis not present

## 2020-11-03 DIAGNOSIS — Z046 Encounter for general psychiatric examination, requested by authority: Secondary | ICD-10-CM | POA: Insufficient documentation

## 2020-11-03 DIAGNOSIS — I10 Essential (primary) hypertension: Secondary | ICD-10-CM | POA: Diagnosis not present

## 2020-11-03 DIAGNOSIS — R4689 Other symptoms and signs involving appearance and behavior: Secondary | ICD-10-CM | POA: Diagnosis not present

## 2020-11-03 DIAGNOSIS — Z20822 Contact with and (suspected) exposure to covid-19: Secondary | ICD-10-CM | POA: Diagnosis not present

## 2020-11-03 DIAGNOSIS — Z008 Encounter for other general examination: Secondary | ICD-10-CM

## 2020-11-03 LAB — COMPREHENSIVE METABOLIC PANEL
ALT: 25 U/L (ref 0–44)
AST: 24 U/L (ref 15–41)
Albumin: 4.8 g/dL (ref 3.5–5.0)
Alkaline Phosphatase: 59 U/L (ref 38–126)
Anion gap: 12 (ref 5–15)
BUN: 12 mg/dL (ref 6–20)
CO2: 28 mmol/L (ref 22–32)
Calcium: 9.8 mg/dL (ref 8.9–10.3)
Chloride: 99 mmol/L (ref 98–111)
Creatinine, Ser: 0.78 mg/dL (ref 0.61–1.24)
GFR, Estimated: >60 mL/min (ref >60)
Glucose, Bld: 110 mg/dL — ABNORMAL HIGH (ref 70–99)
Potassium: 3.9 mmol/L (ref 3.5–5.1)
Sodium: 139 mmol/L (ref 135–145)
Total Bilirubin: 0.5 mg/dL (ref 0.3–1.2)
Total Protein: 8.7 g/dL — ABNORMAL HIGH (ref 6.5–8.1)

## 2020-11-03 LAB — RESP PANEL BY RT-PCR (FLU A&B, COVID) ARPGX2
Influenza A by PCR: NEGATIVE
Influenza B by PCR: NEGATIVE
SARS Coronavirus 2 by RT PCR: NEGATIVE

## 2020-11-03 LAB — CBC WITH DIFFERENTIAL/PLATELET
Abs Immature Granulocytes: 0.09 10*3/uL — ABNORMAL HIGH (ref 0.00–0.07)
Basophils Absolute: 0.1 10*3/uL (ref 0.0–0.1)
Basophils Relative: 1 %
Eosinophils Absolute: 0.1 10*3/uL (ref 0.0–0.5)
Eosinophils Relative: 1 %
HCT: 48.3 % (ref 39.0–52.0)
Hemoglobin: 15.8 g/dL (ref 13.0–17.0)
Immature Granulocytes: 1 %
Lymphocytes Relative: 18 %
Lymphs Abs: 2.2 10*3/uL (ref 0.7–4.0)
MCH: 29.5 pg (ref 26.0–34.0)
MCHC: 32.7 g/dL (ref 30.0–36.0)
MCV: 90.3 fL (ref 80.0–100.0)
Monocytes Absolute: 1.1 10*3/uL — ABNORMAL HIGH (ref 0.1–1.0)
Monocytes Relative: 9 %
Neutro Abs: 9 10*3/uL — ABNORMAL HIGH (ref 1.7–7.7)
Neutrophils Relative %: 70 %
Platelets: 308 10*3/uL (ref 150–400)
RBC: 5.35 MIL/uL (ref 4.22–5.81)
RDW: 15.9 % — ABNORMAL HIGH (ref 11.5–15.5)
WBC: 12.6 10*3/uL — ABNORMAL HIGH (ref 4.0–10.5)
nRBC: 0 % (ref 0.0–0.2)

## 2020-11-03 LAB — ACETAMINOPHEN LEVEL: Acetaminophen (Tylenol), Serum: <10 ug/mL — ABNORMAL LOW (ref 10–30)

## 2020-11-03 LAB — SALICYLATE LEVEL: Salicylate Lvl: <7 mg/dL — ABNORMAL LOW (ref 7.0–30.0)

## 2020-11-03 LAB — ETHANOL: Alcohol, Ethyl (B): <10 mg/dL (ref <10)

## 2020-11-03 LAB — VALPROIC ACID LEVEL: Valproic Acid Lvl: 63 ug/mL (ref 50.0–100.0)

## 2020-11-03 MED ORDER — LOSARTAN POTASSIUM 25 MG PO TABS
100.0000 mg | ORAL_TABLET | Freq: Every day | ORAL | Status: DC
  Administered 2020-11-04: 100 mg via ORAL
  Filled 2020-11-03: qty 4

## 2020-11-03 MED ORDER — FLUOXETINE HCL 20 MG PO CAPS: 60.0000 mg | ORAL_CAPSULE | Freq: Every day | ORAL | Status: DC

## 2020-11-03 MED ORDER — HYDROCHLOROTHIAZIDE 25 MG PO TABS: 25.0000 mg | ORAL_TABLET | Freq: Every day | ORAL | Status: DC

## 2020-11-03 MED ORDER — DIVALPROEX SODIUM ER 500 MG PO TB24: 1500.0000 mg | ORAL_TABLET | Freq: Every day | ORAL | Status: DC

## 2020-11-03 MED ORDER — FLUOXETINE HCL 20 MG PO CAPS
60.0000 mg | ORAL_CAPSULE | Freq: Every day | ORAL | Status: DC
  Administered 2020-11-04: 60 mg via ORAL
  Filled 2020-11-03: qty 3

## 2020-11-03 MED ORDER — BENZTROPINE MESYLATE 1 MG PO TABS: 1.0000 mg | ORAL_TABLET | Freq: Every day | ORAL | Status: DC

## 2020-11-03 MED ORDER — DIVALPROEX SODIUM ER 500 MG PO TB24
1500.0000 mg | ORAL_TABLET | Freq: Every day | ORAL | Status: DC
Start: 2020-11-03 — End: 2020-11-05

## 2020-11-03 MED ORDER — LOSARTAN POTASSIUM 25 MG PO TABS: 100.0000 mg | ORAL_TABLET | Freq: Every day | ORAL | Status: DC

## 2020-11-03 MED ORDER — HYDROCHLOROTHIAZIDE 25 MG PO TABS
25.0000 mg | ORAL_TABLET | Freq: Every day | ORAL | Status: DC
  Administered 2020-11-04: 25 mg via ORAL
  Filled 2020-11-03: qty 1

## 2020-11-03 NOTE — ED Provider Notes (Signed)
[facility]-EMERGENCY DEPT Provider Note   CSN: [id number] Arrival date & time: 11/03/20  1930     History Chief Complaint  Patient presents with  . Agitation    ED MANDICH is a 40 y.o. male with a history of OCD, Tourette's, depression, and schizophrenia.  Presents IVC taken out by his DSS guardian representative.    Per IVC paperwork outlines that she is suicidal without a plan that he does not want to live anymore.  IVC also reports that patient believes a man is following him and that his family is treating him badly.  Patient's guardian attests that he eats well and family takes great care of him and all his allegations are in his head.    Patient reports that he is been having issues at home with his aunt and uncle whom he lives with.  He reports that he feels like he has to "beg" to get his medications from his aunt.  Patient also reports that he feels like he is not fed enough states sometimes he only gets 1 meal a day.  Patient dates that today around 3 PM he "went off," reports that he got knocked over his TV and flipped his bead over.    Denies any suicidal ideations, has any plan for suicide.  She denies any history of suicide attempts.  Patient denies any homicidal ideations.  Denies any auditory or visual hallucinations.  She endorses compliance with his medications.  Patient reports he was last hospitalized for behavioral health issues in 2008.    Per chart review patient likely had multiple behavioral health admissions from 2001-2004.  Per chart review patient on Cogentin, Depakote, Prozac, Haldol.    HPI     Past Medical History:  Diagnosis Date  . Asthma   . Complication of anesthesia    grandmother states that he told her he had trouble waking up  . Depression   . Family history of adverse reaction to anesthesia    grandmother had trouble waking up  . Hypertension   . OCD (obsessive compulsive disorder)   . OCD (obsessive compulsive  disorder)   . Tobacco abuse   . Tourette's disease     Patient Active Problem List   Diagnosis Date Noted  . Abscess of male breast 09/01/2018  . Cellulitis of left breast 09/01/2018  . Sepsis (HCC) 09/01/2018  . HTN (hypertension) 09/01/2018  . Asthma   . OCD (obsessive compulsive disorder)   . Tourette's disease   . Tobacco abuse     Past Surgical History:  Procedure Laterality Date  . CIRCUMCISION    . I & D EXTREMITY  08/24/2012   Procedure: IRRIGATION AND DEBRIDEMENT EXTREMITY;  Surgeon: Dominica Severin, MD;  Location: [facility] OR;  Service: Orthopedics;  Laterality: Left;  Irrigation and debridement left third finger       Family History  Problem Relation Age of Onset  . Mental illness Mother     Social History   Tobacco Use  . Smoking status: Current Every Day Smoker    Packs/day: 2.00    Types: Cigarettes  . Smokeless tobacco: Never Used  Vaping Use  . Vaping Use: Never used  Substance Use Topics  . Alcohol use: Yes    Comment: beer  . Drug use: Yes    Types: Marijuana    Comment: grandmother states he smokes it all the time- had  some  today 10/23/18    Home Medications Prior to Admission medications  Medication Sig Start Date End Date Taking? Authorizing Provider  benztropine (COGENTIN) 1 MG tablet Take 1 mg by mouth at bedtime.     [provider]  Cholecalciferol (VITAMIN D) 50 MCG (2000 UT) tablet Take 2,000 Units by mouth daily.    [provider]  divalproex (DEPAKOTE ER) 500 MG 24 hr tablet Take 1,500 mg by mouth at bedtime. 08/22/18   [provider]  doxycycline (DORYX) 100 MG EC tablet Take 100 mg by mouth 2 (two) times daily.    [provider]  FLUoxetine (PROZAC) 20 MG capsule Take 60 mg by mouth daily. 08/07/18   [provider]  haloperidol decanoate (HALDOL DECANOATE) 100 MG/ML injection Inject 125 mg into the muscle every 30 (thirty) days.     [provider]  hydrochlorothiazide  (HYDRODIURIL) 25 MG tablet Take 25 mg by mouth daily.    [provider]  loratadine (CLARITIN) 10 MG tablet Take 10 mg by mouth at bedtime.     [provider]  losartan (COZAAR) 100 MG tablet Take 100 mg by mouth daily.    [provider]    Allergies    Patient has no known allergies.  Review of Systems   Review of Systems  Constitutional: Negative for chills and fever.  Eyes: Negative for visual disturbance.  Respiratory: Negative for shortness of breath.   Cardiovascular: Negative for chest pain.  Gastrointestinal: Negative for abdominal pain, nausea and vomiting.  Genitourinary: Negative for difficulty urinating and dysuria.  Musculoskeletal: Negative for back pain and neck pain.  Skin: Negative for color change and rash.  Neurological: Negative for dizziness, syncope, light-headedness and headaches.  Psychiatric/Behavioral: Positive for behavioral problems. Negative for agitation, confusion, hallucinations, self-injury and suicidal ideas. The patient is not nervous/anxious.     Physical Exam Updated Vital Signs BP (!) 176/100 (BP Location: Left Arm)   Pulse (!) 104   Temp 99.1 F (37.3 C) (Oral)   Resp 18   Ht 6' (1.829 m)   Wt (!) 158.8 kg   SpO2 95%   BMI 47.47 kg/m   Physical Exam Vitals and nursing note reviewed.  Constitutional:      General: He is not in acute distress.    Appearance: He is morbidly obese. He is not ill-appearing, toxic-appearing or diaphoretic.  HENT:     Head: Normocephalic.  Eyes:     General: No scleral icterus.       Right eye: No discharge.        Left eye: No discharge.  Cardiovascular:     Rate and Rhythm: Tachycardia present.     Heart sounds: Normal heart sounds.  Pulmonary:     Effort: Pulmonary effort is normal. No respiratory distress.     Breath sounds: Normal breath sounds. No stridor. No wheezing, rhonchi or rales.  Abdominal:     General: There is no distension.     Palpations: Abdomen is  soft. There is no mass.     Tenderness: There is no abdominal tenderness. There is no guarding or rebound.     Hernia: No hernia is present.  Musculoskeletal:     Cervical back: Neck supple.  Skin:    General: Skin is warm and dry.     Coloration: Skin is not jaundiced or pale.  Neurological:     General: No focal deficit present.     Mental Status: He is alert.  Psychiatric:        Attention and Perception: He  does not perceive auditory or visual hallucinations.        Mood and Affect: Mood is anxious. Mood is not depressed or elated. Affect is not labile, blunt, flat, angry or tearful.        Speech: He is communicative. Speech is not rapid and pressured, delayed, slurred or tangential.        Behavior: Behavior is not agitated, slowed, aggressive, withdrawn, hyperactive or combative. Behavior is cooperative.        Thought Content: Thought content does not include homicidal or suicidal ideation. Thought content does not include homicidal or suicidal plan.     ED Results / Procedures / Treatments   Labs (all labs ordered are listed, but only abnormal results are displayed) Labs Reviewed  RESP PANEL BY RT-PCR (FLU A&B, COVID) [id number]  COMPREHENSIVE METABOLIC PANEL  ETHANOL  RAPID URINE DRUG SCREEN, HOSP PERFORMED  CBC WITH DIFFERENTIAL/PLATELET  SALICYLATE LEVEL  ACETAMINOPHEN LEVEL  VALPROIC ACID LEVEL    EKG EKG Interpretation  Date/Time:  Tuesday November 03 2020 21:43:00 EST Ventricular Rate:  110 PR Interval:    QRS Duration: 85 QT Interval:  335 QTC Calculation: 454 R Axis:   71 Text Interpretation: Sinus tachycardia Borderline T wave abnormalities 12 Lead; Mason-Likar No significant change since last tracing Confirmed by Richardean Canal [phone]) on 11/03/2020 10:09:17 PM   Radiology No results found.  Procedures Procedures   Medications Ordered in ED Medications  FLUoxetine (PROZAC) capsule 60 mg (has no administration in time range)  hydrochlorothiazide  (HYDRODIURIL) tablet 25 mg (has no administration in time range)  losartan (COZAAR) tablet 100 mg (has no administration in time range)    ED Course  I have reviewed the triage vital signs and the nursing notes.  Pertinent labs & imaging results that were available during my care of the patient were reviewed by me and considered in my medical decision making (see chart for details).    MDM Rules/Calculators/A&P                          Alert 40 year old male in no acute stress, nontoxic appearing.  Patient not agitated or aggressive.  Patient does endorse anxiety.  patient denies suicidal lesions or plan for suicide.  Patient denies any homicidal ideations, auditory hallucinations, visual hallucinations, depression.  Patient said he has been taking his medications as prescribed.  Per IVC paperwork patient believes a man is following him and that his family is treating him badly.  Patient's guardian attests that he eats well and family takes great care of him and all his allegations are in his head.  He does report that he is like he has to "beg" to get medication from his aunt and that he is only fed once a day.  He is noted to be tachycardic; possibly due to his anxiety.  Heart rate improved to 104.    Will obtain EKG, Depakote level, and other medical clearance labs.  Patient does endorse drinking one 24 ounce beer today.  Reports occasional alcohol use.  Denies any illicit drug use.  States last used marijuana 2 months prior.  Endorses smoking half pack cigarettes daily.   EKG shows sinus tachycardia with no significant changes since last tracing.    Will consult TTS to speak with patient.  Home medications ordered. Hold Depakote until  Depakote level is resulted.   Patient was discussed with and evaluated by Dr. Silverio Lay.  If patient's labs are  unremarkable would consider him medically cleared.  Plan to restart Depakote as long as level is not supratherapeutic.    Patient care  transferred to PA Sanders at the end of my shift. Patient presentation, ED course, and plan of care discussed with review of all pertinent labs and imaging. Please see his/her note for further details regarding further ED course and disposition.  Final Clinical Impression(s) / ED Diagnoses Final diagnoses:  Patient needs psychiatric hold for evaluation    Rx / DC Orders ED Discharge Orders    None       Berneice Heinrich 11/03/20 2228    Charlynne Pander, MD 11/03/20 2318    Charlynne Pander, MD 11/03/20 [phone]

## 2020-11-03 NOTE — ED Triage Notes (Signed)
Pt BIB by LE, pt was at home and had been having issues with aunt/uncle. Pt states he got angry and broke a TV and flipped his bed. Pt's guardian took out IVC papers due to patient's recent behavior and reported hallucinations. Pt cooperative during triage but reports feeling anxious and states he is being denied food at home

## 2020-11-03 NOTE — ED Notes (Signed)
Pt belongings taken and placed in Triage nursing Station  Pt cabinet

## 2020-11-04 ENCOUNTER — Encounter (HOSPITAL_COMMUNITY): Payer: Self-pay | Admitting: Registered Nurse

## 2020-11-04 DIAGNOSIS — Z046 Encounter for general psychiatric examination, requested by authority: Secondary | ICD-10-CM

## 2020-11-04 DIAGNOSIS — R4689 Other symptoms and signs involving appearance and behavior: Secondary | ICD-10-CM

## 2020-11-04 DIAGNOSIS — Z638 Other specified problems related to primary support group: Secondary | ICD-10-CM | POA: Diagnosis not present

## 2020-11-04 LAB — RAPID URINE DRUG SCREEN, HOSP PERFORMED
Amphetamines: NOT DETECTED
Barbiturates: NOT DETECTED
Benzodiazepines: NOT DETECTED
Cocaine: NOT DETECTED
Opiates: NOT DETECTED
Tetrahydrocannabinol: POSITIVE — AB

## 2020-11-04 NOTE — TOC Progression Note (Signed)
CSW received call from Geraldine Solar with [facility]. DSS that pts family takes great care of him and he has food in the home. Per Mr. Carin Hock pt gets upset when they do not get exactly what he wants but they do provide him with food and great care. Mr. Carin Hock has updated pts aunt that he will be returning to the home. Pt will need transportation arranged when he is ready for d/c.

## 2020-11-04 NOTE — ED Notes (Signed)
TTS reassessment in process.

## 2020-11-04 NOTE — ED Provider Notes (Signed)
Emergency Medicine Observation Re-evaluation Note  Bryan Cummings is a 40 y.o. male, seen on rounds today.  Pt initially presented to the ED for complaints of Agitation Currently, the patient is resting.  Physical Exam  BP 131/68   Pulse 67   Temp 99.2 F (37.3 C)   Resp 18   Ht 6' (1.829 m)   Wt (!) 158.8 kg   SpO2 98%   BMI 47.47 kg/m  Physical Exam General: resting, calm Cardiac: warm well perfused Lungs: even unlabored Psych: calm  ED Course / MDM  EKG:EKG Interpretation  Date/Time:  Tuesday November 03 2020 21:43:00 EST Ventricular Rate:  110 PR Interval:    QRS Duration: 85 QT Interval:  335 QTC Calculation: 454 R Axis:   71 Text Interpretation: Sinus tachycardia Borderline T wave abnormalities 12 Lead; Mason-Likar No significant change since last tracing Confirmed by Richardean Canal [phone]) on 11/03/2020 10:09:17 PM    I have reviewed the labs performed to date as well as medications administered while in observation.  Recent changes in the last 24 hours include psych recommending overnight obs.  Plan  Current plan is for re eval in am. Patient is under full IVC at this time.   Milagros Loll, MD 11/04/20 1018

## 2020-11-04 NOTE — BH Assessment (Signed)
Comprehensive Clinical Assessment (CCA) Note  11/04/2020 Bryan Cummings [id number]   Bryan Cummings is a 40 year old male presenting under IVC due to aggressive behaviors. Patient has history of OCD, Tourette's, depression, and schizophrenia. Presents IVC taken out by his DSS guardian representative. Patient denied SI, HI, psychosis and alcohol/drug usage. Patient reported being in an "unfair predicament". Patient reported having issues with aunt and uncle. Patient reported he got angry and pushed tv over and flipped his bed. Patients guardian took out IVC papers on patient for behaviors and hallucinations. Patient reported trigger being that what he wanted to eat was being denied to him.   Patient reports he was last hospitalized for behavioral health issues in 2008. Per chart review patient likely had multiple behavioral health admissions from 2001-2004. Patient is currently seeing psychiatrist Dr. Merlyn Albert. Patient reported taking medications as prescribed and that medications are working. Patient denied prior suicide attempts and self-harming behaviors. Patient was pleasant and cooperative during assessment.   Collateral contact, Geraldine Solar, legal guardian, [phone], unable to reach at this time, consent given by patient.  PER EDP Per IVC paperwork outlines that she is suicidal without a plan that he does not want to live anymore. IVC also reports that patient believes a man is following him and that his family is treating him badly.  Patient's guardian attests that he eats well and family takes great care of him and all his allegations are in his head.    Disposition Nira Conn, NP, recommends overnight observation for safety and stabilization with psych reassessment in the the AM.  Chief Complaint:  Chief Complaint  Patient presents with  . Agitation   Visit Diagnosis: Conduct disorder  CCA Biopsychosocial Intake/Chief Complaint:  Aggressive behaviors  Current Symptoms/Problems: Flipped  bed and pushed tv over.  Patient Reported Schizophrenia/Schizoaffective Diagnosis in Past: Yes  Strengths: uta  Preferences: uta  Abilities: uta  Type of Services Patient Feels are Needed: uta  Initial Clinical Notes/Concerns: uta  Mental Health Symptoms Depression:  Hopelessness; Worthlessness; Tearfulness; Irritability; Change in energy/activity   Duration of Depressive symptoms: Greater than two weeks   Mania:  None   Anxiety:   None   Psychosis:  None   Duration of Psychotic symptoms: No data recorded  Trauma:  None   Obsessions:  None   Compulsions:  None   Inattention:  None   Hyperactivity/Impulsivity:  N/A   Oppositional/Defiant Behaviors:  None   Emotional Irregularity:  None   Other Mood/Personality Symptoms:  No data recorded   Mental Status Exam Appearance and self-care  Stature:  Average   Weight:  Average weight   Clothing:  No data recorded  Grooming:  Normal   Cosmetic use:  Age appropriate   Posture/gait:  Normal   Motor activity:  Not Remarkable   Sensorium  Attention:  Normal   Concentration:  Normal   Orientation:  X5   Recall/memory:  Normal   Affect and Mood  Affect:  Anxious; Depressed   Mood:  Anxious; Depressed   Relating  Eye contact:  Normal   Facial expression:  Anxious; Sad   Attitude toward examiner:  Cooperative   Thought and Language  Speech flow: Clear and Coherent   Thought content:  Appropriate to Mood and Circumstances   Preoccupation:  None   Hallucinations:  None   Organization:  No data recorded  [employer] of Knowledge:  Average   Intelligence:  Average   Abstraction:  No data recorded  Judgement:  Poor   Reality Testing:  No data recorded  Insight:  Poor   Decision Making:  Impulsive   Social Functioning  Social Maturity:  Impulsive   Social Judgement:  No data recorded  Stress  Stressors:  Family conflict   Coping Ability:  Human resources officer  Deficits:  Self-control   Supports:  Family    Religion:   Leisure/Recreation:   Exercise/Diet: Exercise/Diet Do You Have Any Trouble Sleeping?: No  CCA Employment/Education Employment/Work Situation: Employment / Work Situation Employment situation: On disability Why is patient on disability: mental health Has patient ever been in the [employer]?: No  Education: Education Is Patient Currently Attending School?: No Did Garment/textile technologist From [employer]?: Yes Did Theme park manager?: No Did Designer, television/film set?: No Did You Have An Individualized Education Program (IIEP):  Rich Reining) Did You Have Any Difficulty At School?:  Rich Reining) Patient's Education Has Been Impacted by Current Illness:  (uta)  CCA Family/Childhood History Family and Relationship History: Family history Does patient have children?: No  Childhood History:  Childhood History Additional childhood history information: uta Description of patient's relationship with caregiver when they were a child: uta Patient's description of current relationship with people who raised him/her: uta How were you disciplined when you got in trouble as a child/adolescent?: uta Does patient have siblings?: No Did patient suffer any verbal/emotional/physical/sexual abuse as a child?: No Did patient suffer from severe childhood neglect?: No Has patient ever been sexually abused/assaulted/raped as an adolescent or adult?: No Was the patient ever a victim of a crime or a disaster?: No Witnessed domestic violence?: No Has patient been affected by domestic violence as an adult?: No  Child/Adolescent Assessment:   CCA Substance Use Alcohol/Drug Use: Alcohol / Drug Use Pain Medications: see MAR Prescriptions: see MAR Over the Counter: see MAR History of alcohol / drug use?: No history of alcohol / drug abuse   ASAM's:  Six Dimensions of Multidimensional Assessment  Dimension 1:  Acute Intoxication and/or Withdrawal Potential:       Dimension 2:  Biomedical Conditions and Complications:      Dimension 3:  Emotional, Behavioral, or Cognitive Conditions and Complications:     Dimension 4:  Readiness to Change:     Dimension 5:  Relapse, Continued use, or Continued Problem Potential:     Dimension 6:  Recovery/Living Environment:     ASAM Severity Score:    ASAM Recommended Level of Treatment:     Substance use Disorder (SUD)   Recommendations for Services/Supports/Treatments:   DSM5 Diagnoses: Patient Active Problem List   Diagnosis Date Noted  . Abscess of male breast 09/01/2018  . Cellulitis of left breast 09/01/2018  . Sepsis (HCC) 09/01/2018  . HTN (hypertension) 09/01/2018  . Asthma   . OCD (obsessive compulsive disorder)   . Tourette's disease   . Tobacco abuse    Patient Centered Plan: Patient is on the following Treatment Plan(s):  Referrals to Alternative Service(s): Referred to Alternative Service(s):   Place:   Date:   Time:    Referred to Alternative Service(s):   Place:   Date:   Time:    Referred to Alternative Service(s):   Place:   Date:   Time:    Referred to Alternative Service(s):   Place:   Date:   Time:     Burnetta Sabin, [facility]

## 2020-11-04 NOTE — ED Notes (Signed)
Another attempt made to contact legal guardian Bryan Cummings with no answer. Voicemail left.

## 2020-11-04 NOTE — Progress Notes (Addendum)
TOC CM scanned [facility] Waiver to Transportation. TOC CM spoke to CSW, [place]. She will give legal guardian a call to confirm address. ED RN updated. Isidoro Donning RN CCM, [facility] ED TOC CM [phone]
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
## 2020-11-04 NOTE — Discharge Instructions (Signed)
Follow-up with your primary doctor and your regular psychiatrist.  Return to ER for any thoughts of hurting herself or others.

## 2020-11-04 NOTE — BH Assessment (Signed)
Disposition Nira Conn, NP, recommends overnight observation for safety and stabilization with psych reassessment in the the AM.

## 2020-11-04 NOTE — BH Assessment (Signed)
[facility] Assessment Progress Note  Per Shuvon Rankin, NP, this pt does not require psychiatric hospitalization at this time.  Pt presents under IVC initiated by pt's DSS guardian and upheld by EDP Chaney Malling, MD, which has been rescinded by EDP Marianna Fuss, MD.  Pt is psychiatrically cleared.  Discharge instructions advise pt to continue treatment with his current outpatient providers.  At 13:30 this Clinical research associate called pt's [facility] DSS guardian, Geraldine Solar [phone]), to notify him of disposition.  Call rolled to voice mail and I left a message.  At 13:51 Joe calls back.  He reports that pt's family, with whom he lives, is resistent to pt returning to the household, however, they have not taken out a restraining order and they have not initiated an eviction.  He agrees to contact the family to discuss pt's disposition and the intent to return pt to the household.  He also agrees to call me back in a timely manner to finalize plans.  Dr Stevie Kern and pt's nurse, Colin Mulders, have been notified.  Doylene Canning, MA Triage Specialist [phone]

## 2020-11-04 NOTE — Progress Notes (Signed)
CSW and pt met Architect at [employer] entrance.  Pt left [facility] with his transportation. Lurline Idol, MSW, LCSW 2/2/20227:01 PM

## 2020-11-04 NOTE — TOC Progression Note (Signed)
CSW spoke with on call after hours SW with DSS. They do not have pts aunts address and provided CSW with number for Bryan Cummings [phone] pts aunt. CSW left vm.   CSW got returned call from DSS, pt will be returning to [facility].

## 2020-11-04 NOTE — ED Notes (Signed)
TTS assessment in progress. 
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
## 2020-11-04 NOTE — ED Notes (Addendum)
Attempted to contact legal guardian Bryan Cummings at [phone] and the [phone] number used by CSW, no response.
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
## 2020-11-04 NOTE — ED Notes (Signed)
Please call legal Hector Brunswick [phone] with plan of care/when discharged

## 2020-11-04 NOTE — ED Notes (Signed)
Attempted to call legal guardian Geraldine Solar with no answer on office or cell number. HIPPA compliant voicemail left with call back number.

## 2020-11-04 NOTE — ED Notes (Signed)
Pt discharged from this ED in stable condition at this time. All discharge instructions and follow up care reviewed with pt with no further questions at this time. Pt ambulatory with steady gait, clear speech.  

## 2020-11-09 DIAGNOSIS — R69 Illness, unspecified: Secondary | ICD-10-CM | POA: Diagnosis not present

## 2020-12-19 ENCOUNTER — Encounter (HOSPITAL_COMMUNITY): Payer: Self-pay

## 2020-12-19 ENCOUNTER — Other Ambulatory Visit: Payer: Self-pay

## 2020-12-19 ENCOUNTER — Emergency Department (HOSPITAL_COMMUNITY)
Admission: EM | Admit: 2020-12-19 | Discharge: 2020-12-19 | Disposition: A | Discharge status: Home / Self Care | Payer: Medicare HMO | Attending: Emergency Medicine | Admitting: Emergency Medicine

## 2020-12-19 ENCOUNTER — Emergency Department (HOSPITAL_COMMUNITY): Payer: Medicare HMO

## 2020-12-19 DIAGNOSIS — I10 Essential (primary) hypertension: Secondary | ICD-10-CM | POA: Insufficient documentation

## 2020-12-19 DIAGNOSIS — W230XXA Caught, crushed, jammed, or pinched between moving objects, initial encounter: Secondary | ICD-10-CM | POA: Insufficient documentation

## 2020-12-19 DIAGNOSIS — J45909 Unspecified asthma, uncomplicated: Secondary | ICD-10-CM | POA: Insufficient documentation

## 2020-12-19 DIAGNOSIS — L03313 Cellulitis of chest wall: Secondary | ICD-10-CM | POA: Diagnosis not present

## 2020-12-19 DIAGNOSIS — R69 Illness, unspecified: Secondary | ICD-10-CM | POA: Diagnosis not present

## 2020-12-19 DIAGNOSIS — S6992XA Unspecified injury of left wrist, hand and finger(s), initial encounter: Secondary | ICD-10-CM | POA: Diagnosis present

## 2020-12-19 DIAGNOSIS — N611 Abscess of the breast and nipple: Secondary | ICD-10-CM | POA: Diagnosis not present

## 2020-12-19 DIAGNOSIS — S61213A Laceration without foreign body of left middle finger without damage to nail, initial encounter: Secondary | ICD-10-CM

## 2020-12-19 DIAGNOSIS — F17210 Nicotine dependence, cigarettes, uncomplicated: Secondary | ICD-10-CM | POA: Insufficient documentation

## 2020-12-19 MED ORDER — LIDOCAINE HCL 2 % IJ SOLN
10.0000 mL | Freq: Once | INTRAMUSCULAR | Status: AC
  Administered 2020-12-19: 200 mg
  Filled 2020-12-19: qty 20

## 2020-12-19 MED ORDER — LIDOCAINE-EPINEPHRINE-TETRACAINE (LET) TOPICAL GEL
3.0000 mL | Freq: Once | TOPICAL | Status: AC
  Administered 2020-12-19: 3 mL via TOPICAL
  Filled 2020-12-19: qty 3

## 2020-12-19 MED ORDER — SULFAMETHOXAZOLE-TRIMETHOPRIM 800-160 MG PO TABS: 1.0000 | ORAL_TABLET | Freq: Two times a day (BID) | ORAL | 0 refills | Status: AC
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
## 2020-12-19 NOTE — Discharge Instructions (Addendum)
Please take all of your antibiotics until finished!   You may develop abdominal discomfort or diarrhea from the antibiotic.  You may help offset this with probiotics which you can buy or get in yogurt. Do not eat or take the probiotics until 2 hours after your antibiotic.   We recommend follow-up with the primary care provider for further assessment as well as to order a possible ultrasound to assure the breast area is simply a cellulitis with superficial, draining abscess rather than deep abscess or abnormal cell growth.  Return to the emergency department for continued spreading redness on the breast, fever, difficulty breathing, other chest pain, or any other major concerns.  Wound Care - After I&D of Abscess  You may remove the bandage after 24 hours.  The only reason to replace the bandage is to protect clothing from drainage. Bandages, if used, should be replaced daily or whenever soiled. The wound may continue to drain for the next 2-3 days.   Cleaning: Clean the wound and surrounding area gently with tap water and mild soap. Rinse well and blot dry. You may shower normally. Soaking the wound in Epsom salt baths for no more than 15 minutes once a day may help rinse out any remaining pus and help with wound healing.  Clean the wound daily to prevent further infection. Do not use cleaners such as hydrogen peroxide or alcohol.   Scar reduction: Application of a topical antibiotic ointment, such as Neosporin, after the wound has begun to close and heal well can decrease scab formation and reduce scarring. After the wound has healed, application of ointments such as Aquaphor can also reduce scar formation.  The key to scar reduction is keeping the skin well hydrated and supple. Drinking plenty of water throughout the day (At least eight 8oz glasses of water a day) is essential to staying well hydrated.  Sun exposure: Keep the wound out of the sun. After the wound has healed, continue to protect  it from the sun by wearing protective clothing or applying sunscreen.  Pain: You may use Tylenol, naproxen, or ibuprofen for pain.  Prevention: There are some people who have a predisposition to abscess formation, however, there are some things that can be done to prevent abscesses in many people.  Most abscesses form because bacteria that naturally lives on the skin gets trapped underneath the skin.  This can occur through openings too small to see. Before and after any area of skin is shaved, wax, or abraded in any manner, the area should be washed with soap and water and rinsed well.   If you are having trouble with recurrent abscesses, it may be wise to perform a chlorhexidine wash regimen.  For 1 week, wash all of your body with chlorhexidine (available over-the-counter at most pharmacies). You may also need to reevaluate your use of daily soap as soaps with perfumes or dyes can increase the chances of infection in some people.  Follow up: Please return to the ED or go to your primary care provider in 2-3 days for a wound check to assure proper healing.  Return: Return to the ED sooner should signs of worsening infection arise, such as spreading redness, worsening puffiness/swelling, severe increase in pain, fever over 100.34F, or any other major issues.  For prescription assistance, may try using prescription discount sites or apps, such as goodrx.com  Wound Care - Finger wound Wound Cleaning: Clean the wound and surrounding area gently with tap water and mild soap. Rinse  well and blot dry. Do not scrub the wound, as this may cause the wound edges to come apart. You may shower, but avoid submerging the wound, such as with a bath or swimming.  Clean the wound daily to prevent infection.  Do not use cleaners such as hydrogen peroxide or alcohol.   Scar reduction: Application of a topical antibiotic ointment, such as Neosporin, after the wound has begun to close and heal well can decrease scab  formation and reduce scarring. After the wound has healed, application of ointments such as Aquaphor can also reduce scar formation.  The key to scar reduction is keeping the skin well hydrated and supple. Drinking plenty of water throughout the day (At least eight 8oz glasses of water a day) is essential to staying well hydrated.  Sun exposure: Keep the wound out of the sun. After the wound has healed, continue to protect it from the sun by wearing protective clothing or applying sunscreen.  Pain: You may use Tylenol, naproxen, or ibuprofen for pain. Antiinflammatory medications: Take 600 mg of ibuprofen every 6 hours or 440 mg (over the counter dose) to 500 mg (prescription dose) of naproxen every 12 hours for the next 3 days. After this time, these medications may be used as needed for pain. Take these medications with food to avoid upset stomach. Choose only one of these medications, do not take them together. Acetaminophen (generic for Tylenol): Should you continue to have additional pain while taking the ibuprofen or naproxen, you may add in acetaminophen as needed. Your daily total maximum amount of acetaminophen from all sources should be limited to 4000mg /day for persons without liver problems, or 2000mg /day for those with liver problems.  Return: Return to the ED should signs of infection arise, such as spreading redness, puffiness/swelling, pus draining from the wound, severe increase in pain, fever over 100.19F, or any other major issues.  For prescription assistance, may try using prescription discount sites or apps, such as goodrx.com

## 2020-12-19 NOTE — ED Notes (Signed)
No answer when calling patient's legal gaurdian and aunt. Voice mail left with aunt.  Grandmother in [place] not able to come and get him.

## 2020-12-19 NOTE — ED Triage Notes (Signed)
Per EMS- Patient is from home. Patient slammed his left middle finger in a doo ryesterday. A small laceration to the left middle finger is present.

## 2020-12-19 NOTE — ED Notes (Signed)
No contact with aunt or gaurdian after multiple attempts.

## 2020-12-19 NOTE — ED Notes (Signed)
Swollen and red area over right breast with white head at nipple.

## 2020-12-22 DIAGNOSIS — R69 Illness, unspecified: Secondary | ICD-10-CM | POA: Diagnosis not present

## 2020-12-22 DIAGNOSIS — F419 Anxiety disorder, unspecified: Secondary | ICD-10-CM | POA: Diagnosis not present

## 2020-12-22 LAB — AEROBIC CULTURE W GRAM STAIN (SUPERFICIAL SPECIMEN)
Culture: NORMAL
Gram Stain: NONE SEEN

## 2020-12-23 ENCOUNTER — Telehealth: Payer: Self-pay | Admitting: Emergency Medicine

## 2020-12-23 NOTE — Telephone Encounter (Signed)
Post ED Visit - Positive Culture Follow-up  Culture report reviewed by antimicrobial stewardship pharmacist: [facility] []  , Pharm.D. []  Enzo Bi, Pharm.D., BCPS AQ-ID []  , Pharm.D., BCPS []  Celedonio Miyamoto, Pharm.D., BCPS []  [place], Garvin Fila.D., BCPS, AAHIVP []  , Pharm.D., BCPS, AAHIVP []  Georgina Pillion, PharmD, BCPS []  , PharmD, BCPS []  [place], PharmD, BCPS []  [place], PharmD []  , PharmD, BCPS []  Estella Husk, PharmD  Pharmacy Team []  Lysle Pearl, PharmD []  , PharmD []  Phillips Climes, PharmD []  , Rph []  Agapito Games) , PharmD []  Verlan Friends, PharmD []  , PharmD []  Mervyn Gay, PharmD []  , PharmD []  Vinnie Level, PharmD []  Wonda Olds, PharmD []  , PharmD []  Len Childs, PharmD   Positive wound culture Treated with sulfamethoxazole-trimethoprim, organism sensitive to the same and no further patient follow-up is required at this time.  12/23/2020, 10:41 AM

## 2021-02-11 DIAGNOSIS — Z79899 Other long term (current) drug therapy: Secondary | ICD-10-CM | POA: Diagnosis not present

## 2021-02-11 DIAGNOSIS — R69 Illness, unspecified: Secondary | ICD-10-CM | POA: Diagnosis not present

## 2021-02-11 DIAGNOSIS — I10 Essential (primary) hypertension: Secondary | ICD-10-CM | POA: Diagnosis not present

## 2021-02-18 DIAGNOSIS — R69 Illness, unspecified: Secondary | ICD-10-CM | POA: Diagnosis not present

## 2021-05-17 DIAGNOSIS — F419 Anxiety disorder, unspecified: Secondary | ICD-10-CM | POA: Diagnosis not present

## 2021-05-17 DIAGNOSIS — R69 Illness, unspecified: Secondary | ICD-10-CM | POA: Diagnosis not present

## 2021-07-29 DIAGNOSIS — L309 Dermatitis, unspecified: Secondary | ICD-10-CM | POA: Diagnosis not present

## 2021-07-29 DIAGNOSIS — I10 Essential (primary) hypertension: Secondary | ICD-10-CM | POA: Diagnosis not present

## 2021-07-29 DIAGNOSIS — Z23 Encounter for immunization: Secondary | ICD-10-CM | POA: Diagnosis not present

## 2021-07-29 DIAGNOSIS — E669 Obesity, unspecified: Secondary | ICD-10-CM | POA: Diagnosis not present

## 2021-07-29 DIAGNOSIS — L989 Disorder of the skin and subcutaneous tissue, unspecified: Secondary | ICD-10-CM | POA: Diagnosis not present

## 2021-08-03 DIAGNOSIS — E669 Obesity, unspecified: Secondary | ICD-10-CM | POA: Diagnosis not present

## 2021-08-03 DIAGNOSIS — R69 Illness, unspecified: Secondary | ICD-10-CM | POA: Diagnosis not present

## 2021-08-03 DIAGNOSIS — E663 Overweight: Secondary | ICD-10-CM | POA: Diagnosis not present

## 2021-08-03 DIAGNOSIS — I10 Essential (primary) hypertension: Secondary | ICD-10-CM | POA: Diagnosis not present

## 2021-08-03 DIAGNOSIS — E559 Vitamin D deficiency, unspecified: Secondary | ICD-10-CM | POA: Diagnosis not present

## 2021-08-03 DIAGNOSIS — F250 Schizoaffective disorder, bipolar type: Secondary | ICD-10-CM | POA: Diagnosis not present

## 2021-08-03 DIAGNOSIS — Z79899 Other long term (current) drug therapy: Secondary | ICD-10-CM | POA: Diagnosis not present

## 2021-08-03 DIAGNOSIS — F200 Paranoid schizophrenia: Secondary | ICD-10-CM | POA: Diagnosis not present

## 2021-08-13 DIAGNOSIS — F250 Schizoaffective disorder, bipolar type: Secondary | ICD-10-CM | POA: Diagnosis not present

## 2021-08-13 DIAGNOSIS — R69 Illness, unspecified: Secondary | ICD-10-CM | POA: Diagnosis not present

## 2021-11-19 DIAGNOSIS — R69 Illness, unspecified: Secondary | ICD-10-CM | POA: Diagnosis not present

## 2021-11-19 DIAGNOSIS — F419 Anxiety disorder, unspecified: Secondary | ICD-10-CM | POA: Diagnosis not present

## 2021-11-19 DIAGNOSIS — F250 Schizoaffective disorder, bipolar type: Secondary | ICD-10-CM | POA: Diagnosis not present

## 2022-04-08 ENCOUNTER — Ambulatory Visit: Payer: Medicare HMO | Admitting: Podiatry

## 2022-05-13 DIAGNOSIS — R69 Illness, unspecified: Secondary | ICD-10-CM | POA: Diagnosis not present

## 2022-06-02 ENCOUNTER — Telehealth: Payer: Self-pay | Admitting: *Deleted

## 2022-06-02 NOTE — Patient Outreach (Signed)
  Care Coordination   06/02/2022 Name: Bryan Cummings MRN: [id number] DOB: Oct 25, 1980   Care Coordination Outreach Attempts:  An unsuccessful telephone outreach was attempted today to offer the patient information about available care coordination services as a benefit of their health plan.   Follow Up Plan:  Additional outreach attempts will be made to offer the patient care coordination information and services.   Encounter Outcome:  No Answer  Care Coordination Interventions Activated:  No   Care Coordination Interventions:  No, not indicated    Irving Shows [facility] BSN [facility] RN Care Coordinator [phone]

## 2022-06-03 ENCOUNTER — Encounter: Payer: Self-pay | Admitting: *Deleted

## 2022-06-03 ENCOUNTER — Telehealth: Payer: Self-pay | Admitting: *Deleted

## 2022-06-03 NOTE — Patient Outreach (Signed)
  Care Coordination   Initial Visit Note   06/03/2022 Name: Bryan Cummings MRN: [id number] DOB: June 20, 1981  [name] is a 41 y.o. year old male who sees [facility] for primary care. I spoke with  Bryan Cummings's grandmother Bryan Cummings by phone today.  What matters to the patients health and wellness today?  "Bryan Cummings doesn't wanna go to his appointments, fear of going out in public"    Goals Addressed               This Visit's Progress     COMPLETED: "Bryan Cummings doesn't wanna go to his appointment, fear of going out in public" (pt-stated)        Care Coordination Interventions: Patient interviewed about adult health maintenance status including  attending scheduled appointments and scheduling Annual Wellness Visit, keeping up to date on vaccinations. Provided education about importance of monitoring blood pressure at home (caregiver states pt refuses to have blood pressure checked at home), reviewed low sodium diet Grandmother states DSS has guardianship for patient and patient has social worker/ therapist that he sees regularly due to mental illness, issues with not wanting to leave home,  medication changes are made as needed and pt has all medications and taking as prescribed per caregiver.   Grandmother feels what is already in place is very helpful for patient, does not feel like another Child psychotherapist would be beneficial as they have resources and can call social worker/ therapist as needed. RN care manager encouraged caregiver to continue working with therapist regarding mental health issues for pt and to stay in contact for changes in health status, worsening mental health issues. Care coordination program explained to patient's grandmother who feels the services/ resources she has in place is adequate (her daughter also lives with her and pt), agreeable to outreach today but declines any further outreach.         SDOH assessments and interventions  completed:  Yes  SDOH Interventions Today    Flowsheet Row Most Recent Value  SDOH Interventions   Food Insecurity Interventions Intervention Not Indicated  Transportation Interventions Intervention Not Indicated        Care Coordination Interventions Activated:  Yes  Care Coordination Interventions:  Yes, provided   Follow up plan: No further intervention required.   Encounter Outcome:  Pt. Visit Completed   Irving Shows [facility] BSN [facility] RN Care Coordinator [phone]

## 2022-07-27 DIAGNOSIS — R69 Illness, unspecified: Secondary | ICD-10-CM | POA: Diagnosis not present

## 2022-10-12 IMAGING — CR DG FINGER MIDDLE 2+V*L*
3 series · 3 of 3 positions shown · non-contrast
Comparison: Left hand radiographs 08/24/2012

CLINICAL DATA: Patient reported he slammed his left middle finger
in a door yesterday. Pt had a laceration to his left middle finger.

EXAM:
LEFT MIDDLE FINGER 2+V

[x finger pa left]
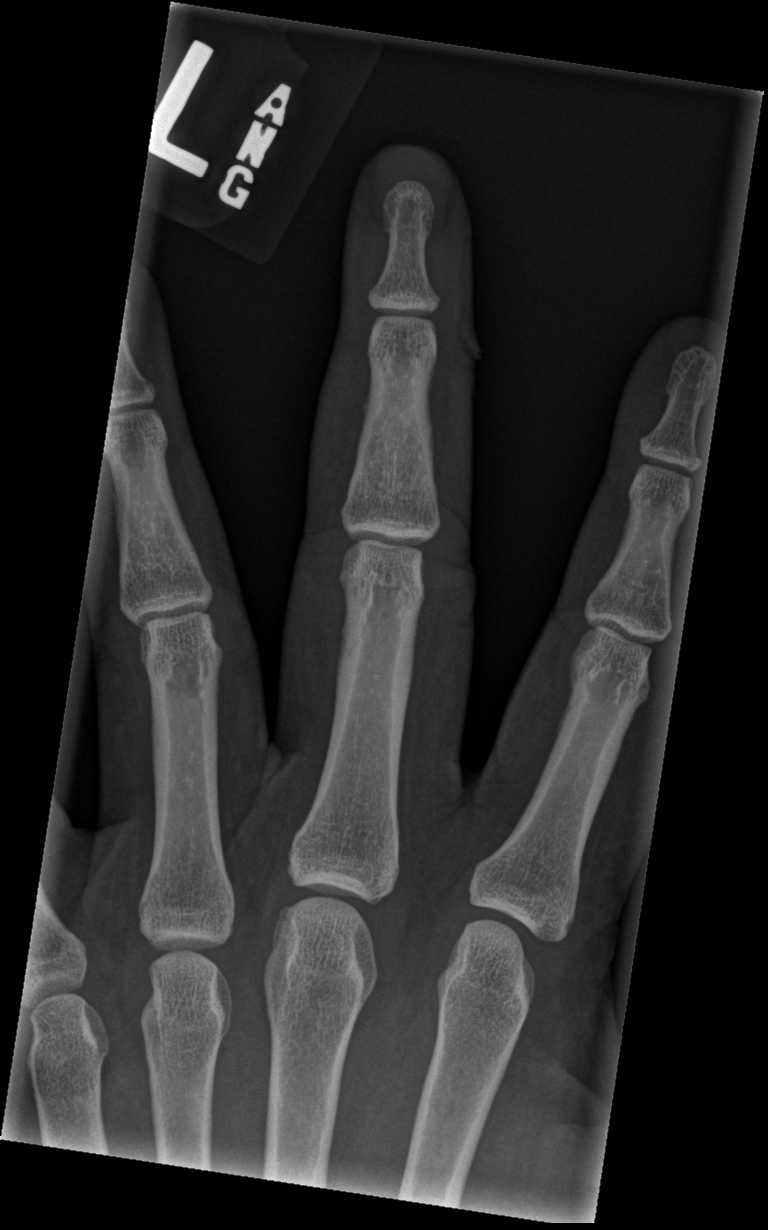

[x finger obl left]
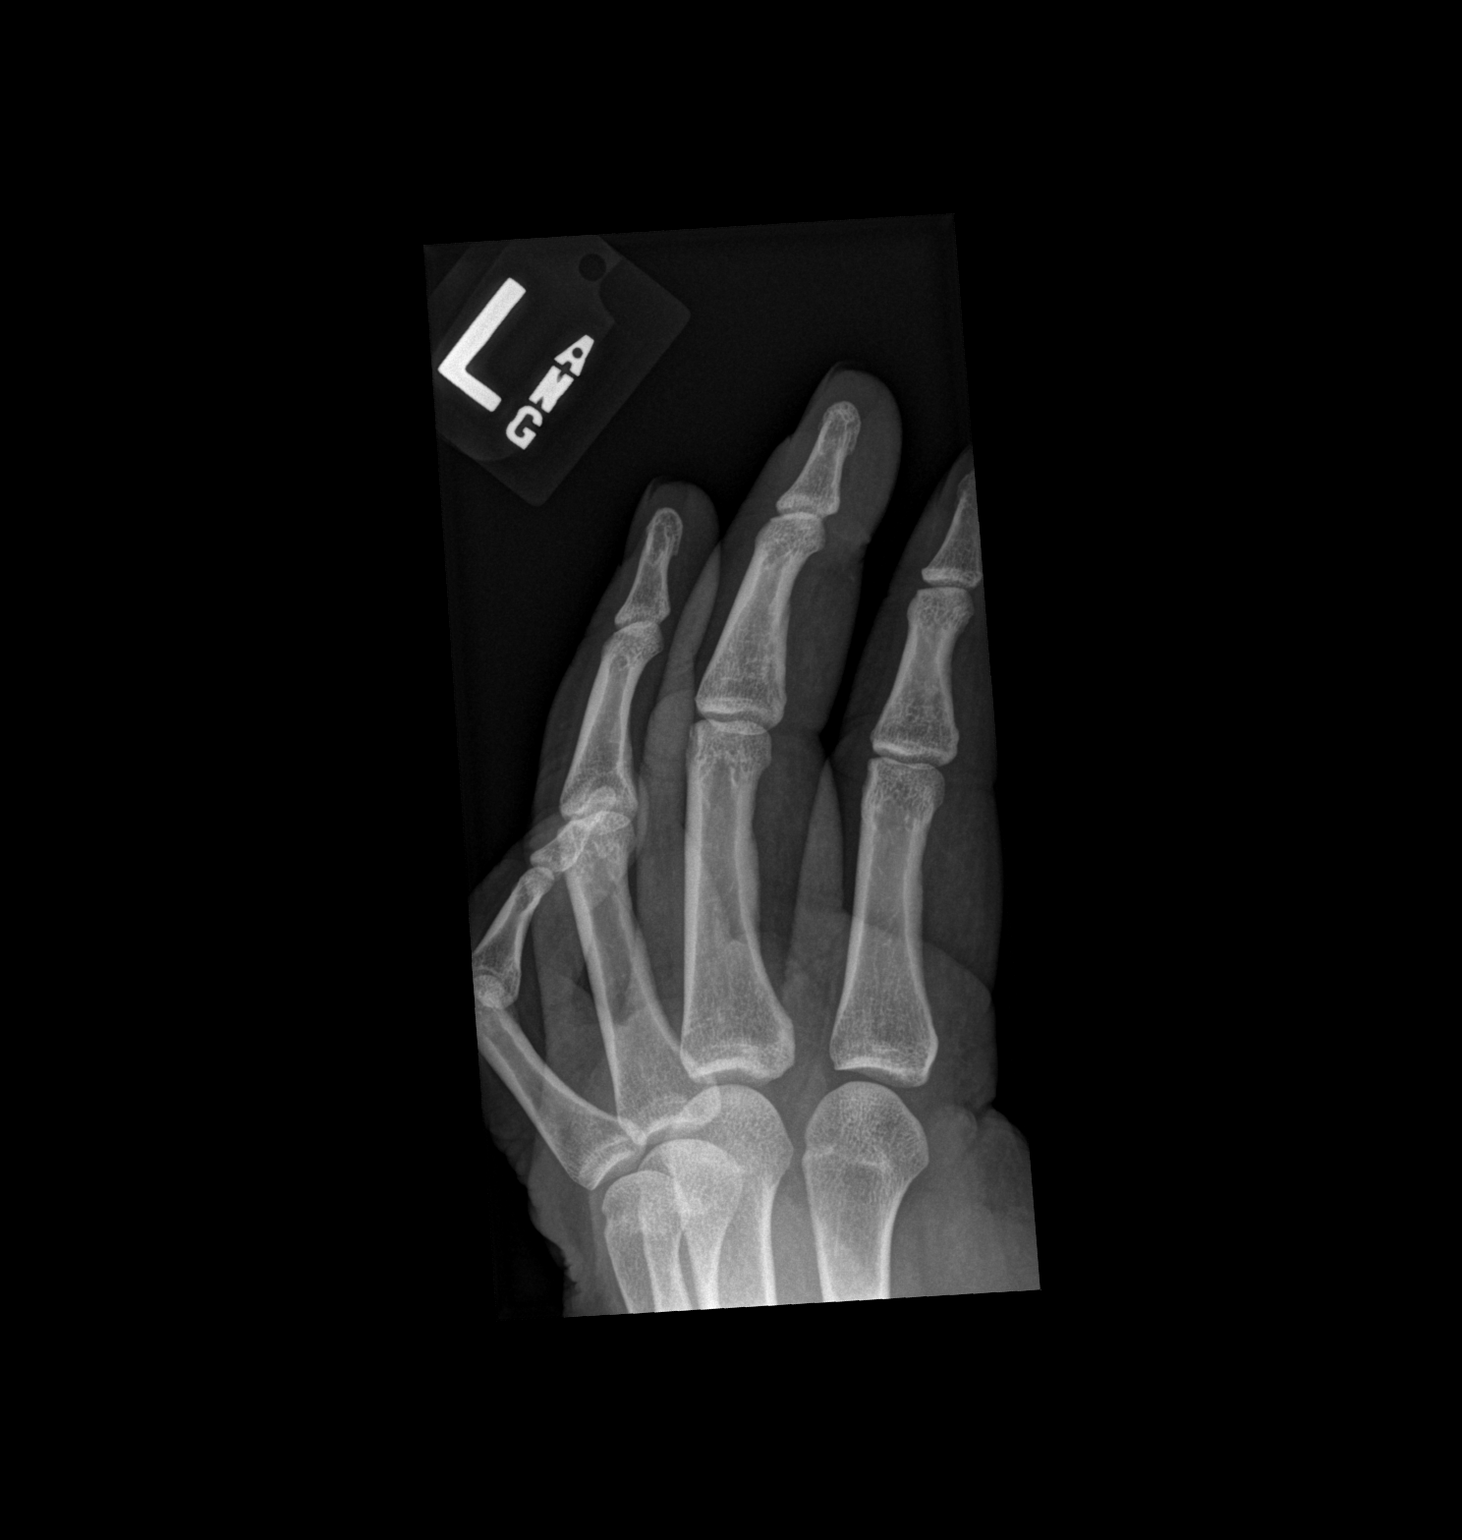

[x finger lat left]
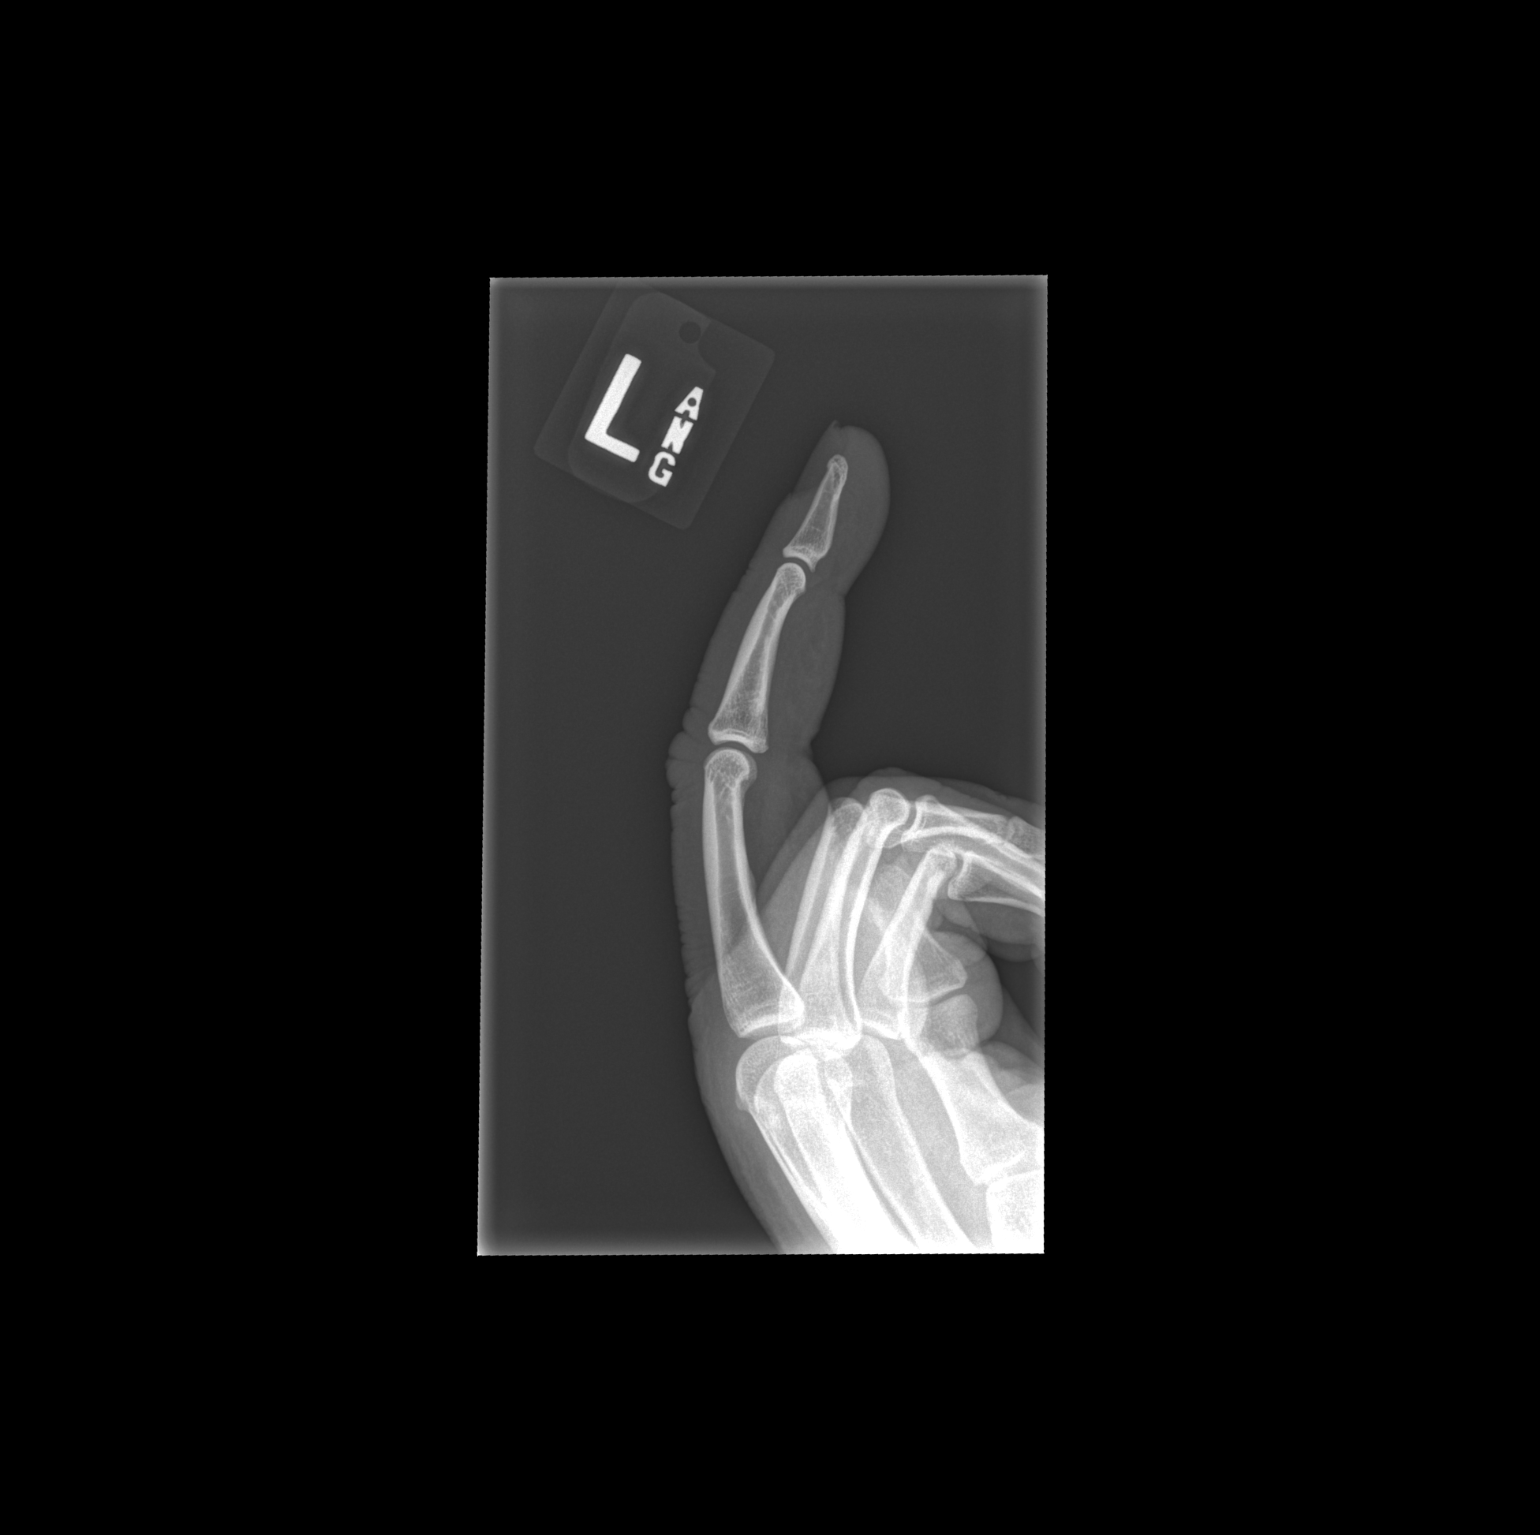

[3 of 3 positions shown; findings below may reference images not displayed]

FINDINGS: There is no evidence of fracture or dislocation. There is no
evidence of arthropathy or other focal bone abnormality. Small soft
tissue defect along the distal medial aspect of the finger,
consistent with laceration.
IMPRESSION: No acute osseous abnormality in the left middle finger. Small soft
tissue laceration.

## 2022-11-09 DIAGNOSIS — R69 Illness, unspecified: Secondary | ICD-10-CM | POA: Diagnosis not present

## 2023-03-14 DIAGNOSIS — F250 Schizoaffective disorder, bipolar type: Secondary | ICD-10-CM | POA: Diagnosis not present

## 2023-03-29 ENCOUNTER — Ambulatory Visit: Payer: Medicare HMO | Admitting: Podiatry

## 2023-11-09 ENCOUNTER — Ambulatory Visit: Payer: Medicare HMO | Admitting: Podiatry

## 2023-11-09 ENCOUNTER — Telehealth: Payer: Self-pay | Admitting: Podiatry

## 2023-11-21 ENCOUNTER — Emergency Department (HOSPITAL_COMMUNITY)
Admission: EM | Admit: 2023-11-21 | Discharge: 2023-11-21 | Discharge status: Against Medical Advice | Payer: Medicare HMO | Attending: Emergency Medicine | Admitting: Emergency Medicine

## 2023-11-21 ENCOUNTER — Encounter (HOSPITAL_COMMUNITY): Payer: Self-pay

## 2023-11-21 ENCOUNTER — Other Ambulatory Visit: Payer: Self-pay

## 2023-11-21 DIAGNOSIS — Z5321 Procedure and treatment not carried out due to patient leaving prior to being seen by health care provider: Secondary | ICD-10-CM | POA: Diagnosis present

## 2023-11-21 DIAGNOSIS — Z5900 Homelessness unspecified: Secondary | ICD-10-CM | POA: Diagnosis present
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
